# Patient Record
Sex: Female | Born: 1935 | Race: White | Hispanic: No | Smoking: Former smoker
Health system: Southern US, Community
[De-identification: ages and names within clinical notes are randomized; demographics above are authoritative.]

## PROBLEM LIST (undated history)

## (undated) DIAGNOSIS — K7689 Other specified diseases of liver: Secondary | ICD-10-CM

## (undated) DIAGNOSIS — L570 Actinic keratosis: Secondary | ICD-10-CM

## (undated) DIAGNOSIS — M810 Age-related osteoporosis without current pathological fracture: Secondary | ICD-10-CM

## (undated) DIAGNOSIS — M069 Rheumatoid arthritis, unspecified: Secondary | ICD-10-CM

## (undated) DIAGNOSIS — I2510 Atherosclerotic heart disease of native coronary artery without angina pectoris: Secondary | ICD-10-CM

## (undated) DIAGNOSIS — R918 Other nonspecific abnormal finding of lung field: Secondary | ICD-10-CM

## (undated) DIAGNOSIS — K635 Polyp of colon: Secondary | ICD-10-CM

## (undated) DIAGNOSIS — E785 Hyperlipidemia, unspecified: Secondary | ICD-10-CM

## (undated) DIAGNOSIS — E039 Hypothyroidism, unspecified: Secondary | ICD-10-CM

## (undated) DIAGNOSIS — E559 Vitamin D deficiency, unspecified: Secondary | ICD-10-CM

## (undated) DIAGNOSIS — N390 Urinary tract infection, site not specified: Secondary | ICD-10-CM

## (undated) HISTORY — PX: BLADDER SURGERY: SHX569

## (undated) HISTORY — PX: COLONOSCOPY: SHX174

## (undated) HISTORY — PX: ABDOMINAL HYSTERECTOMY: SHX81

## (undated) HISTORY — PX: OOPHORECTOMY: SHX86

---
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
## 1997-07-30 ENCOUNTER — Ambulatory Visit (HOSPITAL_COMMUNITY): Admission: RE | Admit: 1997-07-30 | Discharge: 1997-07-30 | Payer: Self-pay | Admitting: *Deleted

## 1997-07-30 ENCOUNTER — Ambulatory Visit (HOSPITAL_COMMUNITY): Admission: RE | Admit: 1997-07-30 | Discharge: 1997-07-30 | Payer: Self-pay | Admitting: Obstetrics & Gynecology

## 1998-08-05 ENCOUNTER — Ambulatory Visit (HOSPITAL_COMMUNITY): Admission: RE | Admit: 1998-08-05 | Discharge: 1998-08-05 | Payer: Self-pay | Admitting: Obstetrics & Gynecology

## 1999-08-11 ENCOUNTER — Ambulatory Visit (HOSPITAL_COMMUNITY): Admission: RE | Admit: 1999-08-11 | Discharge: 1999-08-11 | Payer: Self-pay | Admitting: Obstetrics & Gynecology

## 1999-08-16 ENCOUNTER — Ambulatory Visit (HOSPITAL_COMMUNITY): Admission: RE | Admit: 1999-08-16 | Discharge: 1999-08-16 | Payer: Self-pay | Admitting: Obstetrics & Gynecology

## 2000-08-16 ENCOUNTER — Ambulatory Visit (HOSPITAL_COMMUNITY): Admission: RE | Admit: 2000-08-16 | Discharge: 2000-08-16 | Payer: Self-pay | Admitting: Obstetrics & Gynecology

## 2001-08-22 ENCOUNTER — Ambulatory Visit (HOSPITAL_COMMUNITY): Admission: RE | Admit: 2001-08-22 | Discharge: 2001-08-22 | Payer: Self-pay | Admitting: *Deleted

## 2002-08-28 ENCOUNTER — Ambulatory Visit (HOSPITAL_COMMUNITY): Admission: RE | Admit: 2002-08-28 | Discharge: 2002-08-28 | Payer: Self-pay | Admitting: *Deleted

## 2002-08-28 ENCOUNTER — Ambulatory Visit (HOSPITAL_COMMUNITY): Admission: RE | Admit: 2002-08-28 | Discharge: 2002-08-28 | Payer: Self-pay | Admitting: Internal Medicine

## 2004-09-01 ENCOUNTER — Ambulatory Visit: Payer: Self-pay | Admitting: Internal Medicine

## 2004-12-13 ENCOUNTER — Ambulatory Visit: Payer: Self-pay | Admitting: Internal Medicine

## 2005-09-11 ENCOUNTER — Ambulatory Visit: Payer: Self-pay | Admitting: Internal Medicine

## 2005-09-24 ENCOUNTER — Ambulatory Visit: Payer: Self-pay | Admitting: Unknown Physician Specialty

## 2006-09-18 ENCOUNTER — Ambulatory Visit: Payer: Self-pay | Admitting: Internal Medicine

## 2007-09-24 ENCOUNTER — Ambulatory Visit: Payer: Self-pay | Admitting: Internal Medicine

## 2008-09-27 ENCOUNTER — Ambulatory Visit: Payer: Self-pay | Admitting: Internal Medicine

## 2009-09-28 ENCOUNTER — Ambulatory Visit: Payer: Self-pay | Admitting: Internal Medicine

## 2009-10-12 ENCOUNTER — Ambulatory Visit: Payer: Self-pay | Admitting: Internal Medicine

## 2010-11-01 ENCOUNTER — Ambulatory Visit: Payer: Self-pay | Admitting: Internal Medicine

## 2011-11-16 ENCOUNTER — Ambulatory Visit: Payer: Self-pay | Admitting: Internal Medicine

## 2012-11-18 ENCOUNTER — Ambulatory Visit: Payer: Self-pay | Admitting: Internal Medicine

## 2013-06-13 ENCOUNTER — Emergency Department: Payer: Self-pay | Admitting: Internal Medicine

## 2013-12-29 ENCOUNTER — Ambulatory Visit: Payer: Self-pay | Admitting: Internal Medicine

## 2014-01-06 ENCOUNTER — Ambulatory Visit: Payer: Self-pay | Admitting: Hospice and Palliative Medicine

## 2014-10-20 DIAGNOSIS — Z872 Personal history of diseases of the skin and subcutaneous tissue: Secondary | ICD-10-CM

## 2014-10-20 HISTORY — DX: Personal history of diseases of the skin and subcutaneous tissue: Z87.2

## 2014-12-01 ENCOUNTER — Other Ambulatory Visit: Payer: Self-pay | Admitting: Internal Medicine

## 2014-12-01 DIAGNOSIS — R911 Solitary pulmonary nodule: Secondary | ICD-10-CM

## 2014-12-01 DIAGNOSIS — Z1231 Encounter for screening mammogram for malignant neoplasm of breast: Secondary | ICD-10-CM

## 2014-12-31 ENCOUNTER — Ambulatory Visit: Payer: Self-pay

## 2015-01-07 ENCOUNTER — Ambulatory Visit
Admission: RE | Admit: 2015-01-07 | Discharge: 2015-01-07 | Disposition: A | Discharge status: Home / Self Care | Payer: Medicare Other | Source: Ambulatory Visit | Attending: Internal Medicine | Admitting: Internal Medicine

## 2015-01-07 DIAGNOSIS — I2510 Atherosclerotic heart disease of native coronary artery without angina pectoris: Secondary | ICD-10-CM | POA: Insufficient documentation

## 2015-01-07 DIAGNOSIS — J432 Centrilobular emphysema: Secondary | ICD-10-CM | POA: Diagnosis not present

## 2015-01-07 DIAGNOSIS — R918 Other nonspecific abnormal finding of lung field: Secondary | ICD-10-CM | POA: Insufficient documentation

## 2015-01-07 DIAGNOSIS — R911 Solitary pulmonary nodule: Secondary | ICD-10-CM | POA: Insufficient documentation

## 2015-01-12 ENCOUNTER — Other Ambulatory Visit: Payer: Self-pay | Admitting: Internal Medicine

## 2015-01-12 DIAGNOSIS — R911 Solitary pulmonary nodule: Secondary | ICD-10-CM

## 2015-04-12 ENCOUNTER — Ambulatory Visit: Admission: RE | Admit: 2015-04-12 | Payer: Medicare Other | Source: Ambulatory Visit

## 2015-04-12 ENCOUNTER — Ambulatory Visit
Admission: RE | Admit: 2015-04-12 | Discharge: 2015-04-12 | Disposition: A | Discharge status: Home / Self Care | Payer: Medicare Other | Source: Ambulatory Visit | Attending: Internal Medicine | Admitting: Internal Medicine

## 2015-04-12 DIAGNOSIS — I2510 Atherosclerotic heart disease of native coronary artery without angina pectoris: Secondary | ICD-10-CM | POA: Insufficient documentation

## 2015-04-12 DIAGNOSIS — R911 Solitary pulmonary nodule: Secondary | ICD-10-CM | POA: Diagnosis not present

## 2015-04-12 DIAGNOSIS — R918 Other nonspecific abnormal finding of lung field: Secondary | ICD-10-CM | POA: Insufficient documentation

## 2015-04-13 ENCOUNTER — Other Ambulatory Visit: Payer: Self-pay | Admitting: Internal Medicine

## 2015-04-13 DIAGNOSIS — R911 Solitary pulmonary nodule: Secondary | ICD-10-CM

## 2015-04-25 ENCOUNTER — Telehealth: Payer: Self-pay | Admitting: *Deleted

## 2015-04-25 NOTE — Telephone Encounter (Signed)
Spoke to patient regarding her CT scan for lung cancer screening. She reported having a CT scan done on 04-12-15. She already has next years scheduled for 04-11-16. I will update our records for the screening program.

## 2015-11-16 ENCOUNTER — Ambulatory Visit: Payer: Medicare Other | Admitting: Anesthesiology

## 2015-11-16 ENCOUNTER — Ambulatory Visit
Admission: RE | Admit: 2015-11-16 | Discharge: 2015-11-16 | Disposition: A | Discharge status: Home / Self Care | Payer: Medicare Other | Source: Ambulatory Visit | Attending: Unknown Physician Specialty | Admitting: Unknown Physician Specialty

## 2015-11-16 ENCOUNTER — Encounter: Payer: Self-pay | Admitting: *Deleted

## 2015-11-16 ENCOUNTER — Encounter
Admission: RE | Disposition: A | Discharge status: Home / Self Care | Payer: Self-pay | Source: Ambulatory Visit | Attending: Unknown Physician Specialty

## 2015-11-16 DIAGNOSIS — Z79899 Other long term (current) drug therapy: Secondary | ICD-10-CM | POA: Insufficient documentation

## 2015-11-16 DIAGNOSIS — Z7982 Long term (current) use of aspirin: Secondary | ICD-10-CM | POA: Insufficient documentation

## 2015-11-16 DIAGNOSIS — E559 Vitamin D deficiency, unspecified: Secondary | ICD-10-CM | POA: Diagnosis not present

## 2015-11-16 DIAGNOSIS — E039 Hypothyroidism, unspecified: Secondary | ICD-10-CM | POA: Insufficient documentation

## 2015-11-16 DIAGNOSIS — Z880 Allergy status to penicillin: Secondary | ICD-10-CM | POA: Diagnosis not present

## 2015-11-16 DIAGNOSIS — I2510 Atherosclerotic heart disease of native coronary artery without angina pectoris: Secondary | ICD-10-CM | POA: Insufficient documentation

## 2015-11-16 DIAGNOSIS — Z86010 Personal history of colonic polyps: Secondary | ICD-10-CM | POA: Diagnosis not present

## 2015-11-16 DIAGNOSIS — Z1211 Encounter for screening for malignant neoplasm of colon: Secondary | ICD-10-CM | POA: Diagnosis present

## 2015-11-16 DIAGNOSIS — R918 Other nonspecific abnormal finding of lung field: Secondary | ICD-10-CM | POA: Insufficient documentation

## 2015-11-16 DIAGNOSIS — E785 Hyperlipidemia, unspecified: Secondary | ICD-10-CM | POA: Diagnosis not present

## 2015-11-16 DIAGNOSIS — Z882 Allergy status to sulfonamides status: Secondary | ICD-10-CM | POA: Diagnosis not present

## 2015-11-16 DIAGNOSIS — K621 Rectal polyp: Secondary | ICD-10-CM | POA: Diagnosis not present

## 2015-11-16 DIAGNOSIS — Z90710 Acquired absence of both cervix and uterus: Secondary | ICD-10-CM | POA: Diagnosis not present

## 2015-11-16 DIAGNOSIS — F17210 Nicotine dependence, cigarettes, uncomplicated: Secondary | ICD-10-CM | POA: Diagnosis not present

## 2015-11-16 DIAGNOSIS — M810 Age-related osteoporosis without current pathological fracture: Secondary | ICD-10-CM | POA: Insufficient documentation

## 2015-11-16 DIAGNOSIS — Z87440 Personal history of urinary (tract) infections: Secondary | ICD-10-CM | POA: Diagnosis not present

## 2015-11-16 HISTORY — DX: Vitamin D deficiency, unspecified: E55.9

## 2015-11-16 HISTORY — DX: Age-related osteoporosis without current pathological fracture: M81.0

## 2015-11-16 HISTORY — DX: Hypothyroidism, unspecified: E03.9

## 2015-11-16 HISTORY — PX: COLONOSCOPY WITH PROPOFOL: SHX5780

## 2015-11-16 HISTORY — DX: Urinary tract infection, site not specified: N39.0

## 2015-11-16 HISTORY — DX: Atherosclerotic heart disease of native coronary artery without angina pectoris: I25.10

## 2015-11-16 HISTORY — DX: Hyperlipidemia, unspecified: E78.5

## 2015-11-16 HISTORY — DX: Other nonspecific abnormal finding of lung field: R91.8

## 2015-11-16 HISTORY — DX: Polyp of colon: K63.5

## 2015-11-16 HISTORY — DX: Other specified diseases of liver: K76.89

## 2015-11-16 HISTORY — PX: ESOPHAGOGASTRODUODENOSCOPY (EGD) WITH PROPOFOL: SHX5813

## 2015-11-16 SURGERY — ESOPHAGOGASTRODUODENOSCOPY (EGD) WITH PROPOFOL
Anesthesia: General

## 2015-11-16 MED ORDER — SODIUM CHLORIDE 0.9 % IV SOLN: INTRAVENOUS | Status: DC

## 2015-11-16 MED ORDER — PROPOFOL 500 MG/50ML IV EMUL
INTRAVENOUS | Status: DC | PRN
  Administered 2015-11-16: 125 ug/kg/min via INTRAVENOUS

## 2015-11-16 MED ORDER — PHENYLEPHRINE HCL 10 MG/ML IJ SOLN
INTRAMUSCULAR | Status: DC | PRN
  Administered 2015-11-16: 200 ug via INTRAVENOUS

## 2015-11-16 MED ORDER — SODIUM CHLORIDE 0.9 % IV SOLN
INTRAVENOUS | Status: DC
  Administered 2015-11-16: 09:00:00 via INTRAVENOUS

## 2015-11-16 MED ORDER — PROPOFOL 10 MG/ML IV BOLUS
INTRAVENOUS | Status: DC | PRN
  Administered 2015-11-16: 50 mg via INTRAVENOUS

## 2015-11-16 MED ORDER — ONDANSETRON HCL 4 MG/2ML IJ SOLN: 4.0000 mg | Freq: Once | INTRAMUSCULAR | Status: DC | PRN

## 2015-11-16 MED ORDER — FENTANYL CITRATE (PF) 100 MCG/2ML IJ SOLN: 25.0000 ug | INTRAMUSCULAR | Status: DC | PRN

## 2015-11-16 NOTE — Transfer of Care (Signed)
Immediate Anesthesia Transfer of Care Note  Patient: Kaitlyn Bridges  Procedure(s) Performed: Procedure(s): ESOPHAGOGASTRODUODENOSCOPY (EGD) WITH PROPOFOL (N/A) COLONOSCOPY WITH PROPOFOL (N/A)  Patient Location: PACU  Anesthesia Type:General  Level of Consciousness: awake, alert  and oriented  Airway & Oxygen Therapy: Patient Spontanous Breathing and Patient connected to nasal cannula oxygen  Post-op Assessment: Report given to RN and Post -op Vital signs reviewed and stable  Post vital signs: Reviewed and stable  Last Vitals:  Vitals:   11/16/15 0951 11/16/15 0954  BP: (P) 133/80 133/80  Pulse: (P) 80 78  Resp: (P) 20   Temp: 36.3 C     Last Pain:  Vitals:   11/16/15 0954  TempSrc: Tympanic         Complications: No apparent anesthesia complications

## 2015-11-16 NOTE — H&P (Signed)
Primary Care Physician:  Adin Hector, MD Primary Gastroenterologist:  Dr. Vira Agar  Pre-Procedure History & Physical: HPI:  Kaitlyn Bridges is a 81 y.o. female is here for an colonoscopy.   Past Medical History:  Diagnosis Date  . Colon polyps   . Coronary artery disease   . Hyperlipidemia   . Hypothyroidism   . Liver cyst   . Lung nodules   . Osteoporosis   . Recurrent UTI   . Vitamin D deficiency     Past Surgical History:  Procedure Laterality Date  . ABDOMINAL HYSTERECTOMY    . BLADDER SURGERY    . COLONOSCOPY      Prior to Admission medications   Medication Sig Start Date End Date Taking? Authorizing Provider  aspirin EC 81 MG tablet Take 81 mg by mouth daily.   Yes Historical Provider, MD  benzonatate (TESSALON) 200 MG capsule Take 200 mg by mouth 3 (three) times daily as needed for cough.   Yes Historical Provider, MD  buPROPion (WELLBUTRIN XL) 150 MG 24 hr tablet Take 150 mg by mouth daily.   Yes Historical Provider, MD  calcium carbonate (OSCAL) 1500 (600 Ca) MG TABS tablet Take by mouth 2 (two) times daily with a meal.   Yes Historical Provider, MD  levothyroxine (SYNTHROID, LEVOTHROID) 112 MCG tablet Take 112 mcg by mouth daily before breakfast.   Yes Historical Provider, MD  lovastatin (ALTOPREV) 40 MG 24 hr tablet Take 40 mg by mouth at bedtime.   Yes Historical Provider, MD  varenicline (CHANTIX) 1 MG tablet Take 1 mg by mouth 2 (two) times daily.   Yes Historical Provider, MD    Allergies as of 09/22/2015 - never reviewed  Allergen Reaction Noted  . Penicillins Hives 01/07/2015  . Sulfa antibiotics Rash 01/07/2015    History reviewed. No pertinent family history.  Social History   Social History  . Marital status: Divorced    Spouse name: N/A  . Number of children: N/A  . Years of education: N/A   Occupational History  . Not on file.   Social History Main Topics  . Smoking status: Current Every Day Smoker    Packs/day: 0.75    Types:  Cigarettes  . Smokeless tobacco: Never Used  . Alcohol use 4.2 oz/week    7 Glasses of wine per week     Comment: weekly  . Drug use: No  . Sexual activity: Not on file   Other Topics Concern  . Not on file   Social History Narrative  . No narrative on file    Review of Systems: See HPI, otherwise negative ROS  Physical Exam: BP 116/79   Pulse 76   Temp 98.4 F (36.9 C) (Tympanic)   Resp 18   Ht 5' 2.5" (1.588 m)   Wt 51.7 kg (114 lb)   SpO2 98%   BMI 20.52 kg/m  General:   Alert,  pleasant and cooperative in NAD Head:  Normocephalic and atraumatic. Neck:  Supple; no masses or thyromegaly. Lungs:  Clear throughout to auscultation.    Heart:  Regular rate and rhythm. Abdomen:  Soft, nontender and nondistended. Normal bowel sounds, without guarding, and without rebound.   Neurologic:  Alert and  oriented x4;  grossly normal neurologically.  Impression/Plan: Kaitlyn Bridges is here for an colonoscopy to be performed for screening  Risks, benefits, limitations, and alternatives regarding  colonoscopy have been reviewed with the patient.  Questions have been answered.  All  parties agreeable.   Gaylyn Cheers, MD  11/16/2015, 9:21 AM

## 2015-11-16 NOTE — Anesthesia Preprocedure Evaluation (Signed)
Anesthesia Evaluation  Patient identified by MRN, date of birth, ID band Patient awake    Reviewed: Allergy & Precautions, NPO status , Patient's Chart, lab work & pertinent test results  Airway Mallampati: II       Dental   Pulmonary Current Smoker,    Pulmonary exam normal        Cardiovascular + CAD  Normal cardiovascular exam     Neuro/Psych negative neurological ROS  negative psych ROS   GI/Hepatic Neg liver ROS, Hx of colon polyps   Endo/Other  Hypothyroidism   Renal/GU negative Renal ROS  negative genitourinary   Musculoskeletal negative musculoskeletal ROS (+)   Abdominal Normal abdominal exam  (+)   Peds negative pediatric ROS (+)  Hematology negative hematology ROS (+)   Anesthesia Other Findings   Reproductive/Obstetrics                             Anesthesia Physical Anesthesia Plan  ASA: III  Anesthesia Plan: General   Post-op Pain Management:    Induction: Intravenous  Airway Management Planned: Nasal Cannula  Additional Equipment:   Intra-op Plan:   Post-operative Plan:   Informed Consent: I have reviewed the patients History and Physical, chart, labs and discussed the procedure including the risks, benefits and alternatives for the proposed anesthesia with the patient or authorized representative who has indicated his/her understanding and acceptance.   Dental advisory given  Plan Discussed with: CRNA and Surgeon  Anesthesia Plan Comments:         Anesthesia Quick Evaluation

## 2015-11-16 NOTE — Anesthesia Postprocedure Evaluation (Signed)
Anesthesia Post Note  Patient: LULA SCHLAU  Procedure(s) Performed: Procedure(s) (LRB): ESOPHAGOGASTRODUODENOSCOPY (EGD) WITH PROPOFOL (N/A) COLONOSCOPY WITH PROPOFOL (N/A)  Patient location during evaluation: PACU Anesthesia Type: General Level of consciousness: awake and alert and oriented Pain management: pain level controlled Vital Signs Assessment: post-procedure vital signs reviewed and stable Respiratory status: spontaneous breathing Cardiovascular status: blood pressure returned to baseline Anesthetic complications: no    Last Vitals:  Vitals:   11/16/15 1011 11/16/15 1021  BP: (!) 117/96 123/90  Pulse: 68 74  Resp: 17 (!) 23  Temp:      Last Pain:  Vitals:   11/16/15 0954  TempSrc: Tympanic                 Kiarrah Rausch

## 2015-11-16 NOTE — Op Note (Signed)
[facility] Gastroenterology Patient Name: Kaitlyn Bridges Procedure Date: 11/16/2015 9:23 AM MRN: [id number] Account #: [id number] Date of Birth: [birth year] Admit Type: Outpatient Age: 81 Room: [facility] ENDO ROOM 4 Gender: Female Note Status: Finalized Procedure:            Colonoscopy Indications:          Screening for colorectal malignant neoplasm Providers:            Manya Silvas, MD Referring MD:         Ramonita Lab, MD (Referring MD) Medicines:            Propofol per Anesthesia Complications:        No immediate complications. Procedure:            Pre-Anesthesia Assessment:                       - After reviewing the risks and benefits, the patient                        was deemed in satisfactory condition to undergo the                        procedure.                       After obtaining informed consent, the colonoscope was                        passed under direct vision. Throughout the procedure,                        the patient's blood pressure, pulse, and oxygen                        saturations were monitored continuously. The                        Colonoscope was introduced through the anus and                        advanced to the the cecum, identified by appendiceal                        orifice and ileocecal valve. The colonoscopy was                        performed without difficulty. The patient tolerated the                        procedure well. The quality of the bowel preparation                        was excellent. Findings:      A diminutive polyp was found in the rectum. The polyp was sessile. The       polyp was removed with a jumbo cold forceps. Resection and retrieval       were complete.      The exam was otherwise without abnormality. Impression:           - One diminutive polyp in the rectum, removed with a  jumbo cold forceps. Resected and retrieved.                       - The examination was  otherwise normal. Recommendation:       - Await pathology results. Manya Silvas, MD 11/16/2015 9:48:56 AM This report has been signed electronically. Number of Addenda: 0 Note Initiated On: 11/16/2015 9:23 AM Scope Withdrawal Time: 0 hours 7 minutes 14 seconds  Total Procedure Duration: 0 hours 17 minutes 29 seconds       [facility]

## 2015-11-17 ENCOUNTER — Encounter: Payer: Self-pay | Admitting: Unknown Physician Specialty

## 2015-11-17 LAB — SURGICAL PATHOLOGY

## 2015-12-05 ENCOUNTER — Other Ambulatory Visit: Payer: Self-pay | Admitting: Internal Medicine

## 2015-12-05 DIAGNOSIS — Z1231 Encounter for screening mammogram for malignant neoplasm of breast: Secondary | ICD-10-CM

## 2016-01-12 ENCOUNTER — Ambulatory Visit
Admission: RE | Admit: 2016-01-12 | Discharge: 2016-01-12 | Disposition: A | Discharge status: Home / Self Care | Payer: Medicare Other | Source: Ambulatory Visit | Attending: Internal Medicine | Admitting: Internal Medicine

## 2016-01-12 DIAGNOSIS — Z1231 Encounter for screening mammogram for malignant neoplasm of breast: Secondary | ICD-10-CM | POA: Insufficient documentation

## 2016-04-11 ENCOUNTER — Ambulatory Visit
Admission: RE | Admit: 2016-04-11 | Discharge: 2016-04-11 | Disposition: A | Discharge status: Home / Self Care | Payer: Medicare Other | Source: Ambulatory Visit | Attending: Internal Medicine | Admitting: Internal Medicine

## 2016-04-11 DIAGNOSIS — X58XXXA Exposure to other specified factors, initial encounter: Secondary | ICD-10-CM | POA: Diagnosis not present

## 2016-04-11 DIAGNOSIS — R911 Solitary pulmonary nodule: Secondary | ICD-10-CM

## 2016-04-11 DIAGNOSIS — S22039A Unspecified fracture of third thoracic vertebra, initial encounter for closed fracture: Secondary | ICD-10-CM | POA: Insufficient documentation

## 2016-04-11 DIAGNOSIS — J432 Centrilobular emphysema: Secondary | ICD-10-CM | POA: Insufficient documentation

## 2016-04-11 DIAGNOSIS — I2510 Atherosclerotic heart disease of native coronary artery without angina pectoris: Secondary | ICD-10-CM | POA: Diagnosis not present

## 2016-12-27 ENCOUNTER — Other Ambulatory Visit: Payer: Self-pay | Admitting: Internal Medicine

## 2016-12-27 DIAGNOSIS — Z1231 Encounter for screening mammogram for malignant neoplasm of breast: Secondary | ICD-10-CM

## 2017-01-21 ENCOUNTER — Ambulatory Visit
Admission: RE | Admit: 2017-01-21 | Discharge: 2017-01-21 | Disposition: A | Discharge status: Home / Self Care | Payer: Medicare Other | Source: Ambulatory Visit | Attending: Internal Medicine | Admitting: Internal Medicine

## 2017-01-21 DIAGNOSIS — Z1231 Encounter for screening mammogram for malignant neoplasm of breast: Secondary | ICD-10-CM | POA: Diagnosis not present

## 2017-11-15 IMAGING — CT CT CHEST W/O CM
1 series · 15 of 34 positions shown, 19 images · non-contrast
Comparison: 01/07/2015

CLINICAL DATA: Follow-up lung nodule

EXAM:
CT CHEST WITHOUT CONTRAST
TECHNIQUE: Multidetector CT imaging of the chest was performed following the
standard protocol without IV contrast.

[Series 2: thorax · axial · 0.60mm/px · z∈[-576,-324]mm · 15 of 148 slices shown, 19 images]
[im 11/148  mediastinal]
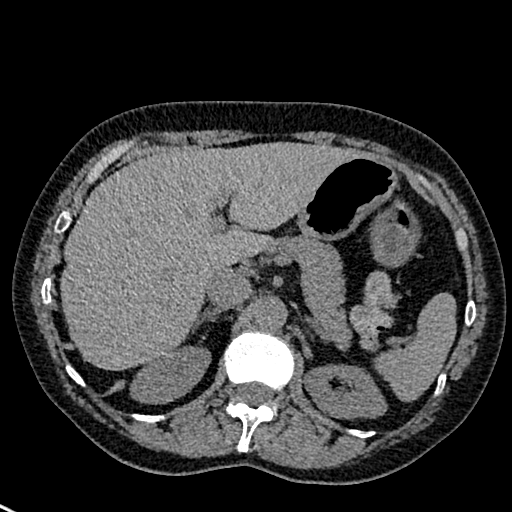
[im 11/148  lung]
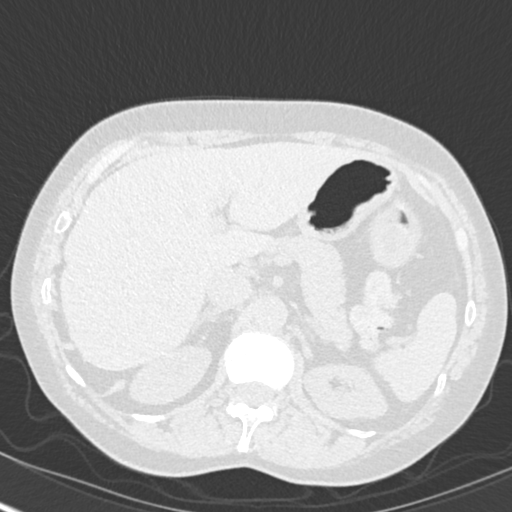
[im 22/148  lung]
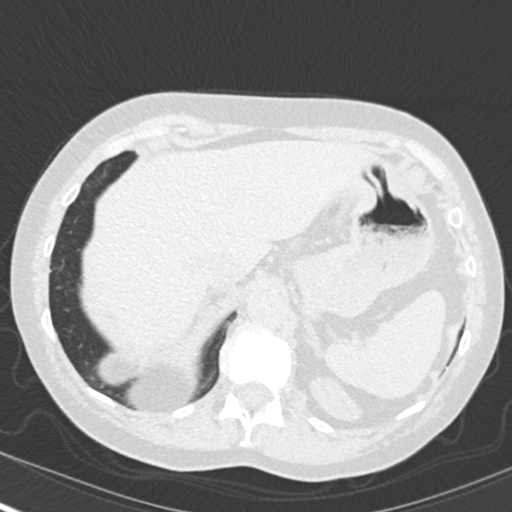
[im 30/148  lung]
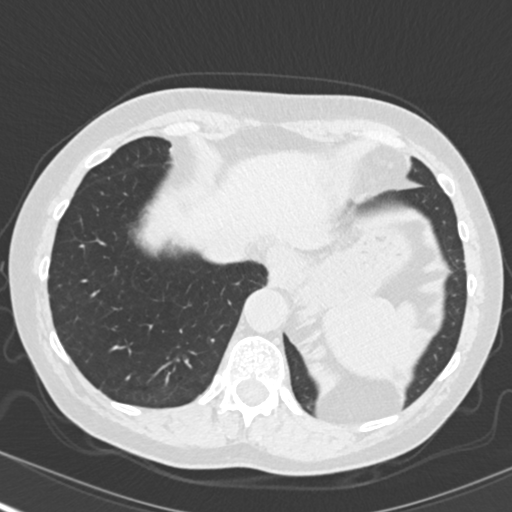
[im 39/148  lung]
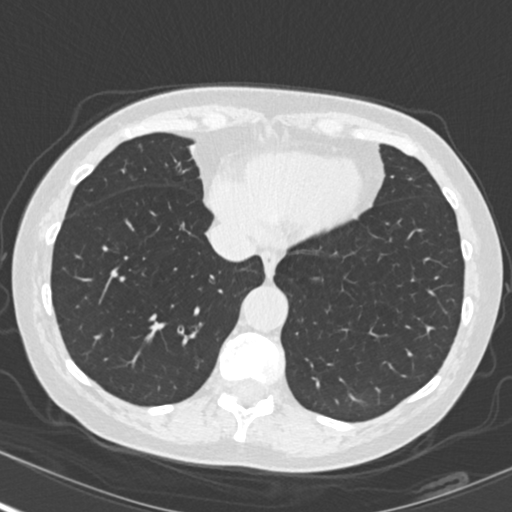
[im 50/148  mediastinal]
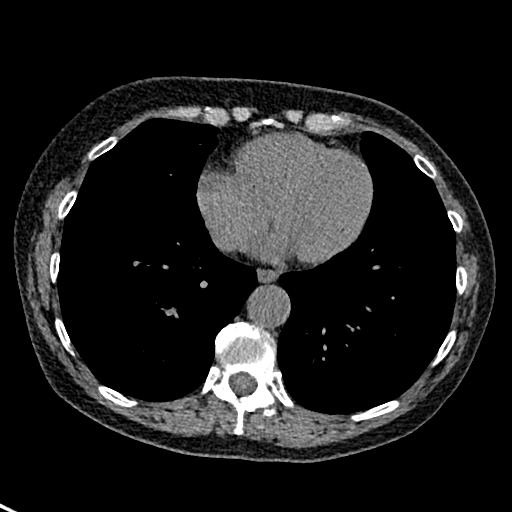
[im 50/148  lung]
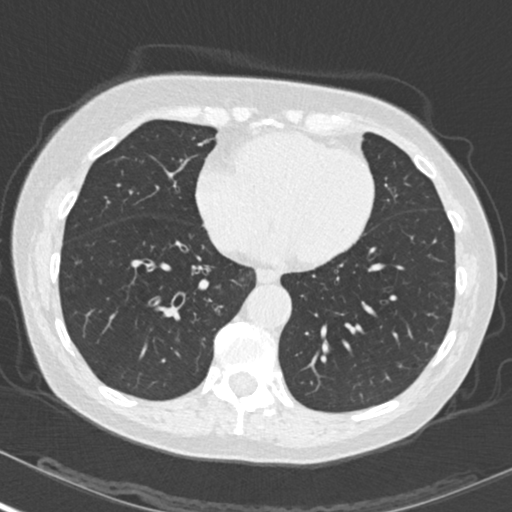
[im 59/148  lung]
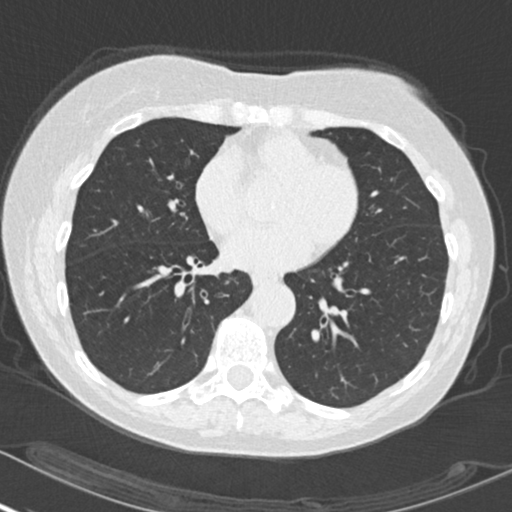
[im 66/148  lung]
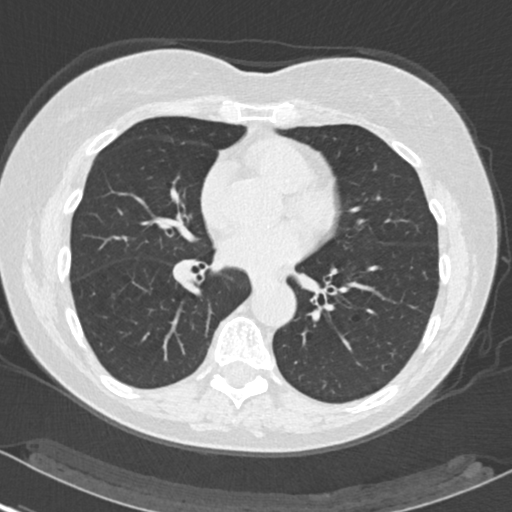
[im 77/148  lung]
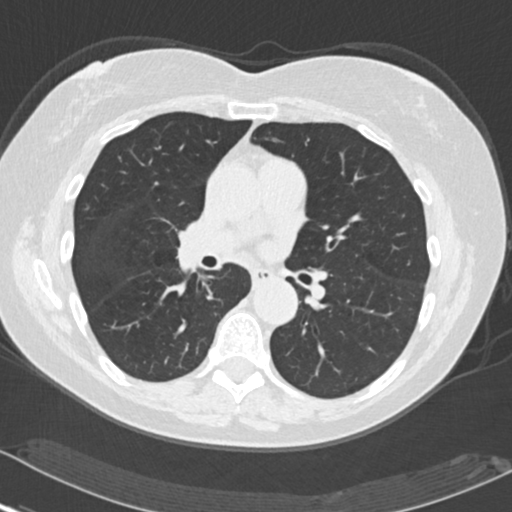
[im 82/148  mediastinal]
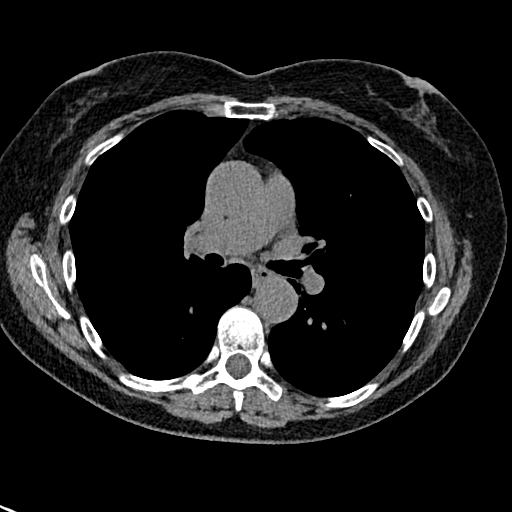
[im 82/148  lung]
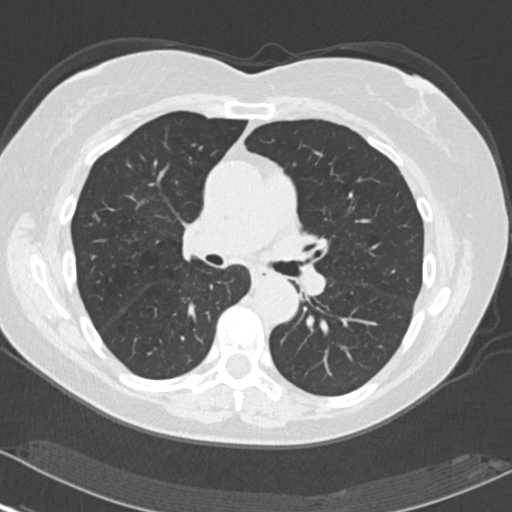
[im 89/148  lung]
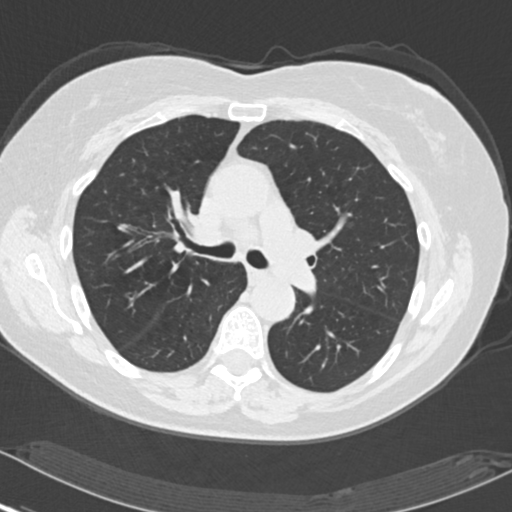
[im 99/148  lung]
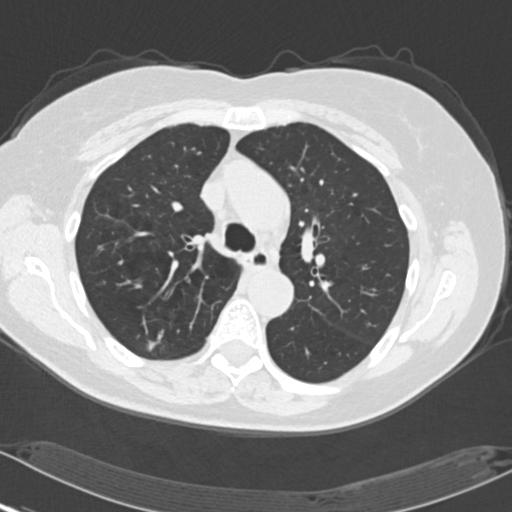
[im 109/148  lung]
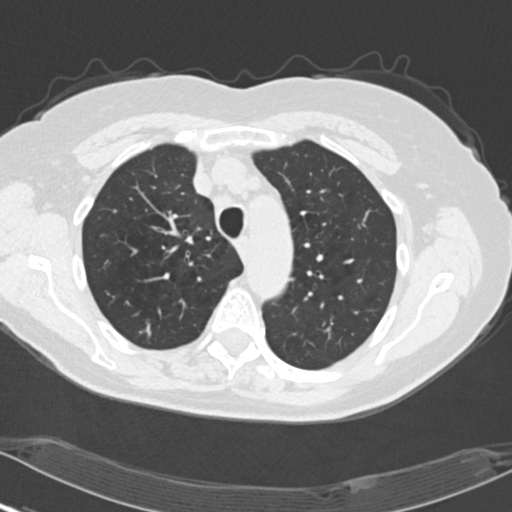
[im 118/148  mediastinal]
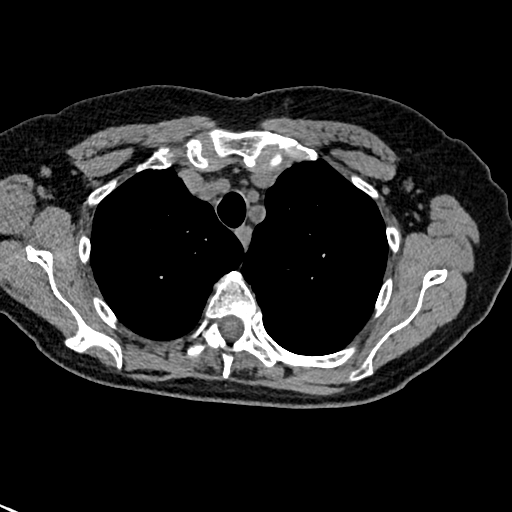
[im 118/148  lung]
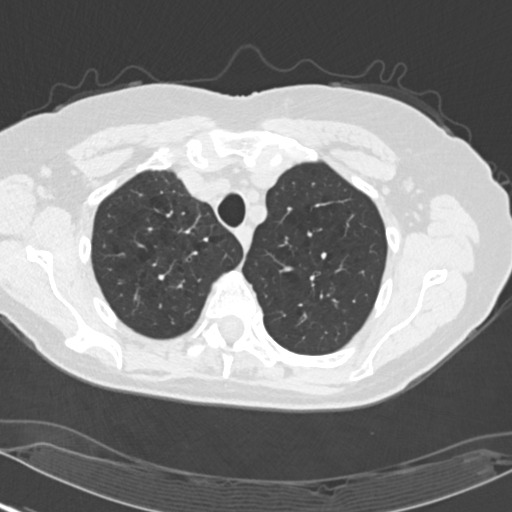
[im 126/148  lung]
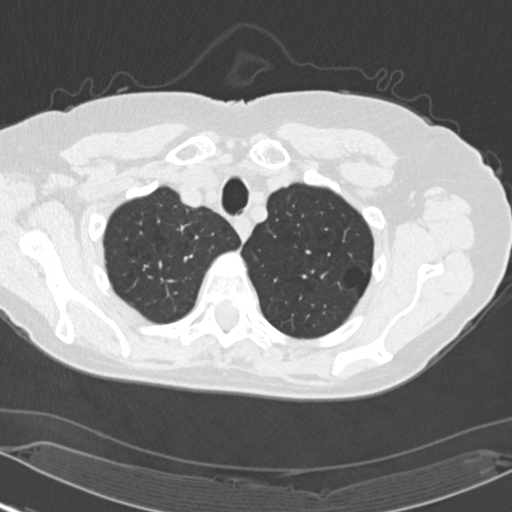
[im 137/148  lung]
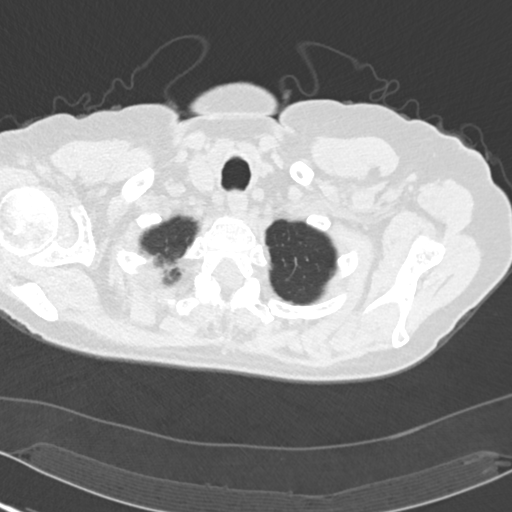

[15 of 34 positions shown; findings below may reference images not displayed]

FINDINGS: Mediastinum/Nodes: Scattered calcifications in the left main and
left anterior descending coronary arteries. Heart is normal size.
Aorta is normal caliber. Small scattered mediastinal lymph nodes,
stable since prior study. No axillary adenopathy. Difficult to
assess for hilar lymph nodes without intravenous contrast.

Lungs/Pleura: Previously seen left lower lobe nodule no longer
visualized. Right upper lobe nodule posteriorly is again noted,
measuring 4 mm compared with 5 mm previously on image 44. Previously
seen ground-glass opacity medially within the right upper lobe as
nearly completely resolved, now with residual solid nodular density
of 3 mm. Other small scattered 1-3 mm nodules are stable. No new or
enlarging pulmonary nodules. No pleural effusions.

Upper abdomen: Imaging into the upper abdomen shows no acute
findings.

Musculoskeletal: Chest wall soft tissues are unremarkable. No acute
bony abnormality or focal bone lesion. Mild compression fractures
involving the T3 and T4 vertebral bodies are stable.
IMPRESSION: The majority of the scattered nodules are stable since prior study.
Improvement in the previously seen ground-glass opacity in the
medial right upper lobe with minimal residual 3 mm solid nodule. No
follow-up needed if patient is low-risk (and has no known or
suspected primary neoplasm). Non-contrast chest CT can be considered
in 12 months if patient is high-risk. This recommendation follows
the consensus statement: Guidelines for Management of Incidental
Pulmonary Nodules Detected on CT Images:From the [HOSPITAL]
0264; published online before print (10.1148/radiol.7598959586).

Coronary artery disease.

## 2018-02-05 ENCOUNTER — Other Ambulatory Visit: Payer: Self-pay | Admitting: Internal Medicine

## 2018-02-05 DIAGNOSIS — Z1231 Encounter for screening mammogram for malignant neoplasm of breast: Secondary | ICD-10-CM

## 2018-02-18 ENCOUNTER — Ambulatory Visit
Admission: RE | Admit: 2018-02-18 | Discharge: 2018-02-18 | Disposition: A | Discharge status: Home / Self Care | Payer: Medicare Other | Source: Ambulatory Visit | Attending: Internal Medicine | Admitting: Internal Medicine

## 2018-02-18 DIAGNOSIS — Z1231 Encounter for screening mammogram for malignant neoplasm of breast: Secondary | ICD-10-CM | POA: Diagnosis present

## 2018-11-03 ENCOUNTER — Emergency Department
Admission: EM | Admit: 2018-11-03 | Discharge: 2018-11-03 | Disposition: A | Discharge status: Home / Self Care | Payer: Medicare Other | Attending: Emergency Medicine | Admitting: Emergency Medicine

## 2018-11-03 ENCOUNTER — Other Ambulatory Visit: Payer: Self-pay

## 2018-11-03 ENCOUNTER — Encounter: Payer: Self-pay | Admitting: Emergency Medicine

## 2018-11-03 ENCOUNTER — Emergency Department: Payer: Medicare Other

## 2018-11-03 DIAGNOSIS — E039 Hypothyroidism, unspecified: Secondary | ICD-10-CM | POA: Insufficient documentation

## 2018-11-03 DIAGNOSIS — Z7982 Long term (current) use of aspirin: Secondary | ICD-10-CM | POA: Diagnosis not present

## 2018-11-03 DIAGNOSIS — F17210 Nicotine dependence, cigarettes, uncomplicated: Secondary | ICD-10-CM | POA: Diagnosis not present

## 2018-11-03 DIAGNOSIS — Z79899 Other long term (current) drug therapy: Secondary | ICD-10-CM | POA: Diagnosis not present

## 2018-11-03 DIAGNOSIS — I2510 Atherosclerotic heart disease of native coronary artery without angina pectoris: Secondary | ICD-10-CM | POA: Insufficient documentation

## 2018-11-03 DIAGNOSIS — M2550 Pain in unspecified joint: Secondary | ICD-10-CM | POA: Diagnosis present

## 2018-11-03 LAB — CBC WITH DIFFERENTIAL/PLATELET
Abs Immature Granulocytes: 0.08 10*3/uL — ABNORMAL HIGH (ref 0.00–0.07)
Basophils Absolute: 0.1 10*3/uL (ref 0.0–0.1)
Basophils Relative: 1 %
Eosinophils Absolute: 0.2 10*3/uL (ref 0.0–0.5)
Eosinophils Relative: 2 %
HCT: 37.7 % (ref 36.0–46.0)
Hemoglobin: 12.1 g/dL (ref 12.0–15.0)
Immature Granulocytes: 1 %
Lymphocytes Relative: 19 %
Lymphs Abs: 2.4 10*3/uL (ref 0.7–4.0)
MCH: 30 pg (ref 26.0–34.0)
MCHC: 32.1 g/dL (ref 30.0–36.0)
MCV: 93.5 fL (ref 80.0–100.0)
Monocytes Absolute: 0.8 10*3/uL (ref 0.1–1.0)
Monocytes Relative: 6 %
Neutro Abs: 9.2 10*3/uL — ABNORMAL HIGH (ref 1.7–7.7)
Neutrophils Relative %: 71 %
Platelets: 452 10*3/uL — ABNORMAL HIGH (ref 150–400)
RBC: 4.03 MIL/uL (ref 3.87–5.11)
RDW: 12.4 % (ref 11.5–15.5)
WBC: 12.8 10*3/uL — ABNORMAL HIGH (ref 4.0–10.5)
nRBC: 0 % (ref 0.0–0.2)

## 2018-11-03 LAB — URINALYSIS, COMPLETE (UACMP) WITH MICROSCOPIC
Bacteria, UA: NONE SEEN
Bilirubin Urine: NEGATIVE
Glucose, UA: NEGATIVE mg/dL
Hgb urine dipstick: NEGATIVE
Ketones, ur: NEGATIVE mg/dL
Leukocytes,Ua: NEGATIVE
Nitrite: NEGATIVE
Protein, ur: NEGATIVE mg/dL
Specific Gravity, Urine: 1.008 (ref 1.005–1.030)
pH: 7 (ref 5.0–8.0)

## 2018-11-03 LAB — BASIC METABOLIC PANEL
Anion gap: 14 (ref 5–15)
BUN: 12 mg/dL (ref 8–23)
CO2: 25 mmol/L (ref 22–32)
Calcium: 8.8 mg/dL — ABNORMAL LOW (ref 8.9–10.3)
Chloride: 99 mmol/L (ref 98–111)
Creatinine, Ser: 0.68 mg/dL (ref 0.44–1.00)
GFR calc Af Amer: >60 mL/min (ref >60)
GFR calc non Af Amer: >60 mL/min (ref >60)
Glucose, Bld: 99 mg/dL (ref 70–99)
Potassium: 4.1 mmol/L (ref 3.5–5.1)
Sodium: 138 mmol/L (ref 135–145)

## 2018-11-03 LAB — SEDIMENTATION RATE: Sed Rate: 62 mm/hr — ABNORMAL HIGH (ref 0–30)

## 2018-11-03 MED ORDER — TRAMADOL HCL 50 MG PO TABS: 50.0000 mg | ORAL_TABLET | Freq: Three times a day (TID) | ORAL | 0 refills | Status: DC | PRN

## 2018-11-03 MED ORDER — TRAMADOL HCL 50 MG PO TABS
50.0000 mg | ORAL_TABLET | Freq: Once | ORAL | Status: AC
  Administered 2018-11-03: 15:00:00 50 mg via ORAL
  Filled 2018-11-03: qty 1

## 2018-11-03 NOTE — Discharge Instructions (Addendum)
Follow-up with Dr. Caryl Comes for evaluation of your joint pain.  More images may need to be done to evaluate more thoroughly.  Do not get on any ladders or stand on stools that may increase your chances of falling with a more severe injury.  Take the tramadol every 8 hours if needed for pain.  Be aware that this medication could cause drowsiness and increase your risk for falling.  Also use the canes that are down in the basement to give you more stability when you are walking.

## 2018-11-03 NOTE — ED Provider Notes (Signed)
[facility] Emergency Department Provider Note   ____________________________________________   First MD Initiated Contact with Patient 11/03/18 1430     (approximate)  I have reviewed the triage vital signs and the nursing notes.   HISTORY  Chief Complaint Joint Pain and Shoulder Pain   HPI Kaitlyn Bridges is a 84 y.o. female presents to the ED with complaint of multiple joint pain.  Patient states that she has had discomfort for 3 weeks.  Patient states that she was on a ladder chopping a tree using pruning shears.  She states that the repetitive movement of using the shears has caused her shoulders to hurt bilaterally.  She denies any fall or injury while on the ladder.  Patient states that after this event she began having more joint and muscle aches.  She takes 1 or 2 Tylenol per day without any relief of her symptoms.  Patient denies any fever, chills, nausea or vomiting.  She denies any abdominal pain or urinary symptoms.  Currently she rates her pain as a 0/10 because she is sitting still.  Walking increases her discomfort.     Past Medical History:  Diagnosis Date   Colon polyps    Coronary artery disease    Hyperlipidemia    Hypothyroidism    Liver cyst    Lung nodules    Osteoporosis    Recurrent UTI    Vitamin D deficiency     There are no active problems to display for this patient.   Past Surgical History:  Procedure Laterality Date   ABDOMINAL HYSTERECTOMY     BLADDER SURGERY     COLONOSCOPY     COLONOSCOPY WITH PROPOFOL N/A 11/16/2015   Procedure: COLONOSCOPY WITH PROPOFOL;  Surgeon: Manya Silvas, MD;  Location: [facility] ENDOSCOPY;  Service: Endoscopy;  Laterality: N/A;   ESOPHAGOGASTRODUODENOSCOPY (EGD) WITH PROPOFOL N/A 11/16/2015   Procedure: ESOPHAGOGASTRODUODENOSCOPY (EGD) WITH PROPOFOL;  Surgeon: Manya Silvas, MD;  Location: [facility] ENDOSCOPY;  Service: Endoscopy;  Laterality: N/A;   OOPHORECTOMY      Prior  to Admission medications   Medication Sig Start Date End Date Taking? Authorizing Provider  aspirin EC 81 MG tablet Take 81 mg by mouth daily.    [provider]  buPROPion (WELLBUTRIN XL) 150 MG 24 hr tablet Take 150 mg by mouth daily.    [provider]  calcium carbonate (OSCAL) 1500 (600 Ca) MG TABS tablet Take by mouth 2 (two) times daily with a meal.    [provider]  levothyroxine (SYNTHROID, LEVOTHROID) 112 MCG tablet Take 112 mcg by mouth daily before breakfast.    [provider]  lovastatin (ALTOPREV) 40 MG 24 hr tablet Take 40 mg by mouth at bedtime.    [provider]  traMADol (ULTRAM) 50 MG tablet Take 1 tablet (50 mg total) by mouth every 8 (eight) hours as needed for moderate pain. 11/03/18   Johnn Hai, PA-C    Allergies Penicillins and Sulfa antibiotics  No family history on file.  Social History Social History   Tobacco Use   Smoking status: Current Every Day Smoker    Packs/day: 0.75    Types: Cigarettes   Smokeless tobacco: Never Used  Substance Use Topics   Alcohol use: Yes    Alcohol/week: 7.0 standard drinks    Types: 7 Glasses of wine per week    Comment: weekly   Drug use: No    Review of Systems Constitutional: No fever/chills Eyes:  No visual changes. Cardiovascular: Denies chest pain. Respiratory: Denies shortness of breath. Gastrointestinal: No abdominal pain.  No nausea, no vomiting.  No diarrhea.  No constipation. Genitourinary: Negative for dysuria. Musculoskeletal: Positive for multiple joint aches including bilateral shoulders, back, bilateral knees. Skin: Negative for rash. Neurological: Negative for headaches, focal weakness or numbness. ____________________________________________   PHYSICAL EXAM:  VITAL SIGNS: ED Triage Vitals  Enc Vitals Group     BP 11/03/18 1240 133/75     Pulse Rate 11/03/18 1240 98     Resp 11/03/18 1240 16     Temp 11/03/18 1240 97.8 F (36.6 C)       Temp src --      SpO2 11/03/18 1240 94 %     Weight 11/03/18 1242 119 lb (54 kg)     Height 11/03/18 1242 5\' 2"  (1.575 m)     Head Circumference --      Peak Flow --      Pain Score 11/03/18 1242 0     Pain Loc --      Pain Edu? --      Excl. in [place]? --     Constitutional: Alert and oriented. Well appearing and in no acute distress. Eyes: Conjunctivae are normal.  Head: Atraumatic. Neck: No stridor.   Cardiovascular: Normal rate, regular rhythm. Grossly normal heart sounds.  Good peripheral circulation. Respiratory: Normal respiratory effort.  No retractions. Lungs CTAB. Gastrointestinal: Soft and nontender. No distention. Musculoskeletal: No gross deformities noted of the shoulders bilaterally and no soft tissue injury present.  Patient is able to move with crepitus and some guarding.  No erythema or discoloration noted.  Patient is also tender to palpation bilateral knees and ankles.  No soft tissue edema, erythema, ecchymosis is noted on the lower extremities to suggest an injury.  There is no warmth or redness to suggest a septic joint.  Patient is able to ambulate without any assistance. Neurologic:  Normal speech and language. No gross focal neurologic deficits are appreciated. No gait instability. Skin:  Skin is warm, dry and intact. No rash noted. Psychiatric: Mood and affect are normal. Speech and behavior are normal.  ____________________________________________   LABS (all labs ordered are listed, but only abnormal results are displayed)  Labs Reviewed  CBC WITH DIFFERENTIAL/PLATELET - Abnormal; Notable for the following components:      Result Value   WBC 12.8 (*)    Platelets 452 (*)    Neutro Abs 9.2 (*)    Abs Immature Granulocytes 0.08 (*)    All other components within normal limits  BASIC METABOLIC PANEL - Abnormal; Notable for the following components:   Calcium 8.8 (*)    All other components within normal limits  URINALYSIS, COMPLETE (UACMP) WITH  MICROSCOPIC - Abnormal; Notable for the following components:   Color, Urine STRAW (*)    APPearance CLEAR (*)    All other components within normal limits  SEDIMENTATION RATE - Abnormal; Notable for the following components:   Sed Rate 62 (*)    All other components within normal limits  SEDIMENTATION RATE    RADIOLOGY   Official radiology report(s): Dg Shoulder Right  Result Date: 11/03/2018 CLINICAL DATA:  Right shoulder pain.  No known injury. EXAM: RIGHT SHOULDER - 2+ VIEW COMPARISON:  None. FINDINGS: There is no evidence of fracture or dislocation. There is no evidence of arthropathy or other focal bone abnormality. Soft tissues are unremarkable. IMPRESSION: Negative. Electronically Signed   By: Marisue Brooklyn.D.  On: 11/03/2018 16:43   Dg Shoulder Left  Result Date: 11/03/2018 CLINICAL DATA:  Left shoulder pain.  No known injury. EXAM: LEFT SHOULDER - 2+ VIEW COMPARISON:  None. FINDINGS: There is no evidence of fracture or dislocation. There is no evidence of arthropathy or other focal bone abnormality. Degenerative changes noted within the visualized lower cervical spine. Soft tissues are unremarkable. IMPRESSION: Negative. Electronically Signed   By: Davina Poke M.D.   On: 11/03/2018 16:41    ____________________________________________   PROCEDURES  Procedure(s) performed (including Critical Care):  Procedures ____________________________________________   INITIAL IMPRESSION / ASSESSMENT AND PLAN / ED COURSE  As part of my medical decision making, I reviewed the following data within the electronic MEDICAL RECORD NUMBER Notes from prior ED visits and [place] Controlled Substance Database  84 year old female presents to the ED with complaint of general joint discomfort that has been going on for approximately 3 weeks.  Patient states that she is very active in her yard doing a lot of yard work and has been Freight forwarder and also climbed on a ladder to use a pair of pruning  shears to a tree.  There was no history of fall.  Patient states that over the next several days she became very sore and stiff in her shoulders.  She later developed some soreness and stiffness both in her back and bilateral knees and ankles.  Patient denies any previous arthritic complaints.  While waiting for lab work and x-ray results patient was given tramadol which she states has improved her comfort.  Patient was noted while in the ED to walk to the restroom unassisted without any difficulty.  X-rays of the shoulders were done due to daughter's concerns.  Degenerative changes was noted more on the left than the right.  There was also degenerative changes noted on the cervical spine.  Daughter states that she will follow-up with her mother's PCP.  Patient was given a prescription for tramadol 1 every 8 hours if needed.  She was made aware that she should use a walker or cane for increased balance and protection from falling if taking this medication.  Patient states that she has canes in her basement and she was encouraged to get these.  We also had a discussion on climbing ladders and doing things that would increase her risk for severe injury such as a fractured hip. ____________________________________________   FINAL CLINICAL IMPRESSION(S) / ED DIAGNOSES  Final diagnoses:  Arthralgia, unspecified joint     ED Discharge Orders         Ordered    traMADol (ULTRAM) 50 MG tablet  Every 8 hours PRN     11/03/18 1707           Note:  This document was prepared using Dragon voice recognition software and may include unintentional dictation errors.    Johnn Hai, PA-C 11/03/18 1755    Carrie Mew, MD 11/04/18 1349

## 2018-11-03 NOTE — ED Notes (Signed)
See triage note  Presents with general joint discpmfort  States is has been going on for about 2 months

## 2018-11-03 NOTE — ED Triage Notes (Signed)
Pt to ED via POV c/o joint pain. Pt reports having trouble getting up and down due to the pain. Pt states that she thinks that this is coming from when she was on a ladder topping a tree. Pt states that after that she started having pain in her left shoulder and then the pain started in all her other joints. Pt denies fever or chills. No other symptoms at this time.

## 2018-11-14 ENCOUNTER — Other Ambulatory Visit: Payer: Self-pay | Admitting: Rheumatology

## 2018-11-14 ENCOUNTER — Other Ambulatory Visit: Payer: Self-pay

## 2018-11-14 ENCOUNTER — Ambulatory Visit
Admission: RE | Admit: 2018-11-14 | Discharge: 2018-11-14 | Disposition: A | Discharge status: Home / Self Care | Payer: Medicare Other | Source: Ambulatory Visit | Attending: Rheumatology | Admitting: Rheumatology

## 2018-11-14 DIAGNOSIS — R600 Localized edema: Secondary | ICD-10-CM

## 2019-01-15 ENCOUNTER — Other Ambulatory Visit: Payer: Self-pay | Admitting: Internal Medicine

## 2019-01-15 DIAGNOSIS — Z1231 Encounter for screening mammogram for malignant neoplasm of breast: Secondary | ICD-10-CM

## 2019-02-20 ENCOUNTER — Ambulatory Visit
Admission: RE | Admit: 2019-02-20 | Discharge: 2019-02-20 | Disposition: A | Discharge status: Home / Self Care | Payer: Medicare Other | Source: Ambulatory Visit | Attending: Internal Medicine | Admitting: Internal Medicine

## 2019-02-20 DIAGNOSIS — Z1231 Encounter for screening mammogram for malignant neoplasm of breast: Secondary | ICD-10-CM | POA: Insufficient documentation

## 2019-02-25 ENCOUNTER — Other Ambulatory Visit: Payer: Self-pay | Admitting: Internal Medicine

## 2019-02-25 DIAGNOSIS — N6320 Unspecified lump in the left breast, unspecified quadrant: Secondary | ICD-10-CM

## 2019-02-25 DIAGNOSIS — R928 Other abnormal and inconclusive findings on diagnostic imaging of breast: Secondary | ICD-10-CM

## 2019-03-06 ENCOUNTER — Ambulatory Visit: Payer: Medicare Other | Attending: Rheumatology | Admitting: Occupational Therapy

## 2019-03-06 ENCOUNTER — Other Ambulatory Visit: Payer: Self-pay

## 2019-03-06 DIAGNOSIS — M6281 Muscle weakness (generalized): Secondary | ICD-10-CM | POA: Diagnosis present

## 2019-03-06 DIAGNOSIS — M25542 Pain in joints of left hand: Secondary | ICD-10-CM | POA: Insufficient documentation

## 2019-03-06 DIAGNOSIS — M25641 Stiffness of right hand, not elsewhere classified: Secondary | ICD-10-CM | POA: Insufficient documentation

## 2019-03-06 DIAGNOSIS — M25541 Pain in joints of right hand: Secondary | ICD-10-CM | POA: Diagnosis present

## 2019-03-06 DIAGNOSIS — R278 Other lack of coordination: Secondary | ICD-10-CM | POA: Insufficient documentation

## 2019-03-06 DIAGNOSIS — M25642 Stiffness of left hand, not elsewhere classified: Secondary | ICD-10-CM

## 2019-03-09 ENCOUNTER — Other Ambulatory Visit: Payer: Medicare Other

## 2019-03-09 ENCOUNTER — Ambulatory Visit: Payer: Medicare Other

## 2019-03-12 ENCOUNTER — Other Ambulatory Visit: Payer: Self-pay

## 2019-03-12 ENCOUNTER — Ambulatory Visit: Payer: Medicare Other | Admitting: Occupational Therapy

## 2019-03-12 DIAGNOSIS — M25542 Pain in joints of left hand: Secondary | ICD-10-CM

## 2019-03-12 DIAGNOSIS — M25642 Stiffness of left hand, not elsewhere classified: Secondary | ICD-10-CM

## 2019-03-12 DIAGNOSIS — M25641 Stiffness of right hand, not elsewhere classified: Secondary | ICD-10-CM

## 2019-03-12 DIAGNOSIS — M25541 Pain in joints of right hand: Secondary | ICD-10-CM

## 2019-03-12 DIAGNOSIS — R278 Other lack of coordination: Secondary | ICD-10-CM

## 2019-03-12 DIAGNOSIS — M6281 Muscle weakness (generalized): Secondary | ICD-10-CM

## 2019-03-13 ENCOUNTER — Encounter: Payer: Self-pay | Admitting: Occupational Therapy

## 2019-03-13 NOTE — Therapy (Signed)
[facility] [address] [place], [address] Phone: [phone]   Fax:  [phone]  Occupational Therapy Treatment  Patient Details  Name: Kaitlyn Bridges MRN: [id number] Date of Birth: [birth year] Referring Provider (OT): Cindie Laroche Jefm Bryant   Encounter Date: 03/12/2019  OT End of Session - 03/13/19 1534    Visit Number  2    Number of Visits  6    Date for OT Re-Evaluation  04/17/19    Authorization Type  Medicare    OT Start Time  1601    OT Stop Time  1648    OT Time Calculation (min)  47 min    Activity Tolerance  Patient tolerated treatment well    Behavior During Therapy  [facility] for tasks assessed/performed       Past Medical History:  Diagnosis Date  . Colon polyps   . Coronary artery disease   . Hyperlipidemia   . Hypothyroidism   . Liver cyst   . Lung nodules   . Osteoporosis   . Recurrent UTI   . Vitamin D deficiency     Past Surgical History:  Procedure Laterality Date  . ABDOMINAL HYSTERECTOMY    . BLADDER SURGERY    . COLONOSCOPY    . COLONOSCOPY WITH PROPOFOL N/A 11/16/2015   Procedure: COLONOSCOPY WITH PROPOFOL;  Surgeon: Manya Silvas, MD;  Location: [facility] ENDOSCOPY;  Service: Endoscopy;  Laterality: N/A;  . ESOPHAGOGASTRODUODENOSCOPY (EGD) WITH PROPOFOL N/A 11/16/2015   Procedure: ESOPHAGOGASTRODUODENOSCOPY (EGD) WITH PROPOFOL;  Surgeon: Manya Silvas, MD;  Location: [facility] ENDOSCOPY;  Service: Endoscopy;  Laterality: N/A;  . OOPHORECTOMY      There were no vitals filed for this visit.  Subjective Assessment - 03/13/19 1533    Subjective   Patient reports her hands are much better today than last week.  She has been doing contrast at home followed by tendon gliding exercises.    Pertinent History  Patient's chart indicates RA with joint flaring, worse on left than right with onset appearing after 2nd COVID vaccination.  When discussing with patient today, she is not sure if the  onset was prior to vaccinations or after.  She reports she had an excerbation of symptoms of RA back in October 2020 but then again more recently within the last month.  She had her mammogram a couple days after the vaccine and report noted enlarged lymph nodes.    Patient Stated Goals  Patient reports she wants to be as independent as possible with all tasks and would like to be able to do yardwork.    Currently in Pain?  No/denies    Pain Score  0-No pain       Following contrast, patient seen for ROM exercises with tendon gliding exercises with both right and left hands greater emphasis on left than right.  Patient able to demonstrate composite fisting this date on the left and right.    Patient instructed on and handouts issued regarding energy conservation techniques and joint protection principles for self care, homemaking and with yardwork.  Patient able to demonstrate understanding and will implement strategies over the next week.  Recommended patient take notice of things she is attempting to perform at home that gives her difficulty and make a list to address next session.   This session we discussed larger grip handles on tools, ways to avoid twisting and turning, modification of activities such as handling mulch bags, watering flowers, etc.  Patient instructed on ice massage for use if she has tenderness over A 1 pulley.  No tenderness noted this date.               OT Treatments/Exercises (OP) - 03/13/19 0001      LUE Contrast Bath   Time  11 minutes    Comments  prior to ROM and exercises to decrease edema and pain             OT Education - 03/13/19 1534    Education Details  ice massage, continued contrast bath and HEP    Person(s) Educated  Patient    Methods  Explanation;Demonstration    Comprehension  Verbalized understanding;Returned demonstration          OT Long Term Goals - 03/13/19 1522      OT LONG TERM GOAL #1   Title  Pt to be  independent in HEP to decrease stiffness, pain and swelling in bilateral hands to less than 3/10 at the worst    Baseline  9/10 pain at eval    Time  6    Period  Weeks    Status  New    Target Date  04/17/19      OT LONG TERM GOAL #2   Title  Pt to verbalize 3 joint protection principles to decrease pain and stiffness and increase ease use of hands in ADL's    Baseline  no current knowledge at eval    Time  6    Period  Weeks    Status  New    Target Date  04/17/19      OT LONG TERM GOAL #3   Title  Grip strength increase with more than 5 lbs to grip objects better and without increase symptoms    Baseline  decreased grip bilaterally at eval    Time  6    Period  Weeks    Status  New    Target Date  04/17/19      OT LONG TERM GOAL #4   Title  Patient will demonstrate understanding of modifications to yardwork and identify when tasks are too difficult and options for assist.    Baseline  pt lives alone and highly active in her yard    Time  6    Period  Weeks    Status  New    Target Date  04/17/19            Plan - 03/13/19 1535    Clinical Impression Statement  Patient was able to implement use of contrast and exercises over this last week and pain reduced from 9/10 to 0 today.  No tenderness over A1 pulley this visit, some edema still noted in MPs and recommend patient continue with contrast and tendon glides, use ice massage if needed.  Patient to implement energy conservation and joint protection principles over the next week and will reassess.  Patient able to complete composite fist on the left today with increased ROM.  Will plan to see patient again in 2 weeks.    OT Occupational Profile and History  Detailed Assessment- Review of Records and additional review of physical, cognitive, psychosocial history related to current functional performance    Occupational performance deficits (Please refer to evaluation for details):  ADL's;IADL's;Social Participation;Leisure     Body Structure / Function / Physical Skills  ADL;Coordination;Endurance;GMC;UE functional use;Decreased knowledge of use of DME;Flexibility;IADL;Pain;Dexterity;FMC;Strength;Edema;ROM    Psychosocial Skills  Environmental  Adaptations;Habits;Routines and Behaviors  Rehab Potential  Good    Clinical Decision Making  Limited treatment options, no task modification necessary    Comorbidities Affecting Occupational Performance:  May have comorbidities impacting occupational performance    Modification or Assistance to Complete Evaluation   No modification of tasks or assist necessary to complete eval    OT Frequency  1x / week    OT Duration  8 weeks    OT Treatment/Interventions  Self-care/ADL training;Cryotherapy;Paraffin;Therapeutic exercise;DME and/or AE instruction;Ultrasound;Fluidtherapy;Neuromuscular education;Manual Therapy;Splinting;Moist Heat;Iontophoresis;Contrast Bath;Energy conservation;Passive range of motion;Therapeutic activities;Patient/family education    Consulted and Agree with Plan of Care  Patient       Patient will benefit from skilled therapeutic intervention in order to improve the following deficits and impairments:   Body Structure / Function / Physical Skills: ADL, Coordination, Endurance, GMC, UE functional use, Decreased knowledge of use of DME, Flexibility, IADL, Pain, Dexterity, FMC, Strength, Edema, ROM   Psychosocial Skills: Environmental  Adaptations, Habits, Routines and Behaviors   Visit Diagnosis: Pain in joint of left hand  Pain in joint of right hand  Muscle weakness (generalized)  Other lack of coordination  Stiffness of left hand, not elsewhere classified  Stiffness of right hand, not elsewhere classified    Problem List There are no problems to display for this patient.  Jordayn Mink Oneita Jolly, OTR/L, CLT  Yarissa Reining 03/13/2019, 3:58 PM  [facility] [address]  [place], [address] Phone: [phone]   Fax:  [phone]  Name: SHAQUITTA DIMOS MRN: [id number] Date of Birth: August 14, 1934

## 2019-03-13 NOTE — Therapy (Signed)
[facility] [address], [place], [address] Phone: [phone]   Fax:  [phone]  Occupational Therapy Evaluation  Patient Details  Name: Kaitlyn Bridges MRN: [id number] Date of Birth: March 08, 1934 Referring Provider (OT): Merita Norton   Encounter Date: 03/06/2019  OT End of Session - 03/12/19 1454    Visit Number  1    Number of Visits  6    Date for OT Re-Evaluation  04/17/19    Authorization Type  Medicare    OT Start Time  1009    OT Stop Time  1105    OT Time Calculation (min)  56 min    Activity Tolerance  Patient tolerated treatment well    Behavior During Therapy  [facility] for tasks assessed/performed       Past Medical History:  Diagnosis Date  . Colon polyps   . Coronary artery disease   . Hyperlipidemia   . Hypothyroidism   . Liver cyst   . Lung nodules   . Osteoporosis   . Recurrent UTI   . Vitamin D deficiency     Past Surgical History:  Procedure Laterality Date  . ABDOMINAL HYSTERECTOMY    . BLADDER SURGERY    . COLONOSCOPY    . COLONOSCOPY WITH PROPOFOL N/A 11/16/2015   Procedure: COLONOSCOPY WITH PROPOFOL;  Surgeon: Manya Silvas, MD;  Location: [facility] ENDOSCOPY;  Service: Endoscopy;  Laterality: N/A;  . ESOPHAGOGASTRODUODENOSCOPY (EGD) WITH PROPOFOL N/A 11/16/2015   Procedure: ESOPHAGOGASTRODUODENOSCOPY (EGD) WITH PROPOFOL;  Surgeon: Manya Silvas, MD;  Location: [facility] ENDOSCOPY;  Service: Endoscopy;  Laterality: N/A;  . OOPHORECTOMY      There were no vitals filed for this visit.  Subjective Assessment - 03/12/19 1452    Subjective   Patient reports progressive weakness and pain in bilateral hands since around October 02, 2018 with more recent exacerbation of symptoms about one month ago. Patient reports this affects her daily activities and has increased pain.  Reports she lives alone and daughter is about 3 hours away therefore she needs to be as independent as possible.    Pertinent History  Patient's chart indicates RA with joint flaring, worse on left than right with onset appearing after 2nd COVID vaccination.  When discussing with patient today, she is not sure if the onset was prior to vaccinations or after.  She reports she had an excerbation of symptoms of RA back in October 2020 but then again more recently within the last month.  She had her mammogram a couple days after the vaccine and report noted enlarged lymph nodes.    Patient Stated Goals  Patient reports she wants to be as independent as possible with all tasks and would like to be able to do yardwork.    Currently in Pain?  Yes    Pain Score  9     Pain Location  Hand    Pain Orientation  Left    Pain Descriptors / Indicators  Aching    Pain Type  Chronic pain    Pain Onset  1 to 4 weeks ago    Pain Frequency  Constant    Pain Onset  1 to 4 weeks ago        [facility] OT Assessment - 03/13/19 1457      Assessment   Medical Diagnosis  RA pain in bilateral hands    Referring Provider (OT)  Elida  Right  Prior Therapy  no      Balance Screen   Has the patient fallen in the past 6 months  No      Home  Environment   Family/patient expects to be discharged to:  Private residence    Living Arrangements  Alone    Available Help at Discharge  Family    Type of [place]  One level    Lives With  Alone      Prior Function   Level of [place]  Retired    Leisure  yardwork      ADL   Eating/Feeding  Modified independent    Grooming  Modified independent    Upper Body Bathing  Modified independent    Lower Body Bathing  Modified independent    Upper Body Dressing  Increased time    Lower Body Dressing  Modified independent    Toilet Transfer  Modified independent    [place]  Increased time    Hand Transfer  Modified independent    ADL  comments  Patient reports she now has to hold her cup with both hands to support to bring to mouth, she has difficulty with bra donning and doffing. She requires increased time for all self-care activities. She tends to avoid buttons since they are more difficult now, she is able to perform zippers with increased effort and increased time allowed for task. She has difficulty bending over to pick up objects and she has increased difficulty with opening jars and containers. Prior to this she was very active with performing yardwork  including tasks such as managing bags of mulch.  Patient reports difficulty when shopping to pick up heavy objects especially items such as a six pack of tea, or a case of water.       IADL   Prior Level of Function Shopping  independent    Shopping  Shops independently for small purchases    Prior Level of Function Light Housekeeping  independent    Light Housekeeping  Performs light daily tasks such as dishwashing, bed making    Prior Level of Function Meal Prep  independent    Meal Prep  Able to complete simple warm meal prep    Prior Level of Function Metallurgist own vehicle    Prior Level of Function Medication Managment  independent    Medication Management  Is responsible for taking medication in correct dosages at correct time    Prior Level of Function Loss adjuster, chartered financial matters independently (budgets, writes checks, pays rent, bills goes to bank), collects and keeps track of income      Mobility   Mobility Status  Independent      Written Expression   Dominant Hand  Right      Sensation   Light Touch  Appears Intact    Stereognosis  Appears Intact    Hot/Cold  Appears Intact    Proprioception  Appears Intact      Coordination   Gross Motor Movements are Fluid and Coordinated  Yes    Fine Motor Movements are Fluid and Coordinated  No       Edema   Edema  edema present in bilateral hands      ROM /  Strength   AROM / PROM / Strength  AROM;Strength      AROM   Overall AROM   Deficits    Right Forearm Pronation  80 Degrees    Right Forearm Supination  80 Degrees    Left Forearm Pronation  80 Degrees    Left Forearm Supination  70 Degrees    Right/Left Wrist  Left    Right Wrist Extension  50 Degrees    Right Wrist Flexion  55 Degrees    Left Wrist Extension  32 Degrees    Left Wrist Flexion  55 Degrees      Strength   Overall Strength  Deficits    Right Hand Grip (lbs)  7    Right Hand Lateral Pinch  8 lbs    Right Hand 3 Point Pinch  8 lbs    Left Hand Grip (lbs)  3    Left Hand Lateral Pinch  4 lbs    Left Hand 3 Point Pinch  4 lbs      Right Hand AROM   R Thumb IP 0-80  60 Degrees    R Index  MCP 0-90  75 Degrees   -10 ext   R Index PIP 0-100  75 Degrees    R Long  MCP 0-90  80 Degrees   -10 ext   R Long PIP 0-100  75 Degrees    R Ring  MCP 0-90  80 Degrees   -10 ext   R Ring PIP 0-100  75 Degrees    R Little  MCP 0-90  80 Degrees   -10 ext   R Little PIP 0-100  80 Degrees      Left Hand AROM   L Thumb MCP 0-60  50 Degrees    L Thumb IP 0-80  50 Degrees    L Index  MCP 0-90  65 Degrees   -25 MP ext   L Index PIP 0-100  65 Degrees    L Long  MCP 0-90  67 Degrees   -30 exr   L Long PIP 0-100  75 Degrees    L Ring  MCP 0-90  80 Degrees   -10 ext   L Ring PIP 0-100  75 Degrees    L Little  MCP 0-90  80 Degrees   -5 ext   L Little PIP 0-100  80 Degrees       Right:  Patient has full opposition on the right hand with full fisting , pain with index and long finger with flexion.   Left: Left UE worse than right, increased tenderness over the A1 pulley Partial fisting decreased flexion at index and long finger, ring and small almost to palm Opposition to small finger with increased effort, increased pain to 9/10 and unable to oppose thumb to base of small finger.    Pain with supination Edema  present dorsally at index and long finger around MP joints.     Patient reports hand pain and ROM worsened about 1 month ago and may be related to COVID vaccine.                    OT Education - 03/12/19 1454    Education Details  findings of eval, plan of care, home exercises, contrast bath    Person(s) Educated  Patient    Methods  Explanation;Demonstration    Comprehension  Verbalized understanding;Returned demonstration          OT  Long Term Goals - 03/13/19 1522      OT LONG TERM GOAL #1   Title  Pt to be independent in HEP to decrease stiffness, pain and swelling in bilateral hands to less than 3/10 at the worst    Baseline  9/10 pain at eval    Time  6    Period  Weeks    Status  New    Target Date  04/17/19      OT LONG TERM GOAL #2   Title  Pt to verbalize 3 joint protection principles to decrease pain and stiffness and increase ease use of hands in ADL's    Baseline  no current knowledge at eval    Time  6    Period  Weeks    Status  New    Target Date  04/17/19      OT LONG TERM GOAL #3   Title  Grip strength increase with more than 5 lbs to grip objects better and without increase symptoms    Baseline  decreased grip bilaterally at eval    Time  6    Period  Weeks    Status  New    Target Date  04/17/19      OT LONG TERM GOAL #4   Title  Patient will demonstrate understanding of modifications to yardwork and identify when tasks are too difficult and options for assist.    Baseline  pt lives alone and highly active in her yard    Time  6    Period  Weeks    Status  New    Target Date  04/17/19            Plan - 03/12/19 1454    Clinical Impression Statement  Patient is an 85 year old female with a history of rheumatoid arthritis who had a recent exacerbation of symptoms and was referred to occupational therapy for evaluation.  She lives alone and has been previously independent with all tasks including being very active and  including extensive yardwork. She presents with bilateral hand pain with left hand worse than right, decreased grip strength, muscle weakness, decreased pinch skills, pain, and decreased ability to perform daily tasks. Patient would benefit from skilled occupational therapy to maximize safety and independence in ADL and IADL tasks to allow her to continue to live independently in the community.    OT Occupational Profile and History  Detailed Assessment- Review of Records and additional review of physical, cognitive, psychosocial history related to current functional performance    Occupational performance deficits (Please refer to evaluation for details):  ADL's;IADL's;Social Participation;Leisure    Body Structure / Function / Physical Skills  ADL;Coordination;Endurance;GMC;UE functional use;Decreased knowledge of use of DME;Flexibility;IADL;Pain;Dexterity;FMC;Strength;Edema;ROM    Psychosocial Skills  Environmental  Adaptations;Habits;Routines and Behaviors    Rehab Potential  Good    Clinical Decision Making  Limited treatment options, no task modification necessary    Comorbidities Affecting Occupational Performance:  May have comorbidities impacting occupational performance    Modification or Assistance to Complete Evaluation   No modification of tasks or assist necessary to complete eval    OT Frequency  1x / week    OT Duration  8 weeks    OT Treatment/Interventions  Self-care/ADL training;Cryotherapy;Paraffin;Therapeutic exercise;DME and/or AE instruction;Ultrasound;Fluidtherapy;Neuromuscular education;Manual Therapy;Splinting;Moist Heat;Iontophoresis;Contrast Bath;Energy conservation;Passive range of motion;Therapeutic activities;Patient/family education    OT Home Exercise Plan  Patient educated on contrast bath for edema management, tendon gliding exercises for ROM    Consulted and  Agree with Plan of Care  Patient       Patient will benefit from skilled therapeutic intervention in order  to improve the following deficits and impairments:   Body Structure / Function / Physical Skills: ADL, Coordination, Endurance, GMC, UE functional use, Decreased knowledge of use of DME, Flexibility, IADL, Pain, Dexterity, FMC, Strength, Edema, ROM   Psychosocial Skills: Environmental  Adaptations, Habits, Routines and Behaviors   Visit Diagnosis: Pain in joint of left hand  Pain in joint of right hand  Muscle weakness (generalized)  Other lack of coordination  Stiffness of left hand, not elsewhere classified  Stiffness of right hand, not elsewhere classified    Problem List There are no problems to display for this patient.  Tynesha Free Oneita Jolly, OTR/L, CLT  Audie Wieser 03/13/2019, 3:28 PM  [facility] [address] [place], [address] Phone: [phone]   Fax:  [phone]  Name: Kaitlyn Bridges MRN: [id number] Date of Birth: 1934-07-26

## 2019-03-19 ENCOUNTER — Ambulatory Visit: Payer: Medicare Other | Admitting: Occupational Therapy

## 2019-03-23 ENCOUNTER — Ambulatory Visit
Admission: RE | Admit: 2019-03-23 | Discharge: 2019-03-23 | Disposition: A | Discharge status: Home / Self Care | Payer: Medicare Other | Source: Ambulatory Visit | Attending: Internal Medicine | Admitting: Internal Medicine

## 2019-03-23 DIAGNOSIS — N6320 Unspecified lump in the left breast, unspecified quadrant: Secondary | ICD-10-CM

## 2019-03-23 DIAGNOSIS — R928 Other abnormal and inconclusive findings on diagnostic imaging of breast: Secondary | ICD-10-CM

## 2019-03-26 ENCOUNTER — Other Ambulatory Visit: Payer: Self-pay

## 2019-03-26 ENCOUNTER — Ambulatory Visit: Payer: Medicare Other | Admitting: Occupational Therapy

## 2019-03-26 ENCOUNTER — Encounter: Payer: Self-pay | Admitting: Occupational Therapy

## 2019-03-26 DIAGNOSIS — M25641 Stiffness of right hand, not elsewhere classified: Secondary | ICD-10-CM

## 2019-03-26 DIAGNOSIS — R278 Other lack of coordination: Secondary | ICD-10-CM

## 2019-03-26 DIAGNOSIS — M6281 Muscle weakness (generalized): Secondary | ICD-10-CM

## 2019-03-26 DIAGNOSIS — M25542 Pain in joints of left hand: Secondary | ICD-10-CM

## 2019-03-26 DIAGNOSIS — M25541 Pain in joints of right hand: Secondary | ICD-10-CM

## 2019-03-26 DIAGNOSIS — M25642 Stiffness of left hand, not elsewhere classified: Secondary | ICD-10-CM

## 2019-03-27 NOTE — Therapy (Signed)
[facility] [address], [place], [address] Phone: [phone]   Fax:  [phone]  Occupational Therapy Treatment/Discharge Summary   Patient Details  Name: Kaitlyn Bridges MRN: [id number] Date of Birth: 06-22-1934 Referring Provider (OT): Merita Norton   Encounter Date: 03/26/2019  OT End of Session - 03/27/19 1133    Visit Number  3    Number of Visits  6    Date for OT Re-Evaluation  04/17/19    Authorization Type  Medicare    OT Start Time  1600    OT Stop Time  1650    OT Time Calculation (min)  50 min    Activity Tolerance  Patient tolerated treatment well    Behavior During Therapy  [facility] for tasks assessed/performed       Past Medical History:  Diagnosis Date  . Colon polyps   . Coronary artery disease   . Hyperlipidemia   . Hypothyroidism   . Liver cyst   . Lung nodules   . Osteoporosis   . Recurrent UTI   . Vitamin D deficiency     Past Surgical History:  Procedure Laterality Date  . ABDOMINAL HYSTERECTOMY    . BLADDER SURGERY    . COLONOSCOPY    . COLONOSCOPY WITH PROPOFOL N/A 11/16/2015   Procedure: COLONOSCOPY WITH PROPOFOL;  Surgeon: Manya Silvas, MD;  Location: [facility] ENDOSCOPY;  Service: Endoscopy;  Laterality: N/A;  . ESOPHAGOGASTRODUODENOSCOPY (EGD) WITH PROPOFOL N/A 11/16/2015   Procedure: ESOPHAGOGASTRODUODENOSCOPY (EGD) WITH PROPOFOL;  Surgeon: Manya Silvas, MD;  Location: [facility] ENDOSCOPY;  Service: Endoscopy;  Laterality: N/A;  . OOPHORECTOMY      There were no vitals filed for this visit.  Subjective Assessment - 03/26/19 1621    Subjective   Has been trying to do yardwork and put out mulch, someone helped her to place the bags and she worked a little at a time.  She reports she has not been as consistent with doing contrast for edema.    Pertinent History  Patient's chart indicates RA with joint flaring, worse on left than right with onset appearing after 2nd COVID  vaccination.  When discussing with patient today, she is not sure if the onset was prior to vaccinations or after.  She reports she had an excerbation of symptoms of RA back in October 2020 but then again more recently within the last month.  She had her mammogram a couple days after the vaccine and report noted enlarged lymph nodes.    Patient Stated Goals  Patient reports she wants to be as independent as possible with all tasks and would like to be able to do yardwork.    Currently in Pain?  No/denies    Pain Score  0-No pain         [facility] OT Assessment - 03/26/19 1608      AROM   Left Wrist Extension  42 Degrees    Left Wrist Flexion  55 Degrees      Strength   Right Hand Grip (lbs)  32   35 after paraffin   Right Hand Lateral Pinch  13 lbs    Right Hand 3 Point Pinch  12 lbs    Left Hand Grip (lbs)  19   22 after paraffin   Left Hand Lateral Pinch  11 lbs    Left Hand 3 Point Pinch  8 lbs      Right Hand AROM  R Thumb IP 0-80  65 Degrees    R Index  MCP 0-90  85 Degrees    R Long  MCP 0-90  85 Degrees    R Ring  MCP 0-90  85 Degrees    R Little  MCP 0-90  85 Degrees      Left Hand AROM   L Thumb IP 0-80  60 Degrees    L Index  MCP 0-90  85 Degrees    L Index PIP 0-100  90 Degrees    L Long  MCP 0-90  80 Degrees    L Long PIP 0-100  90 Degrees    L Ring  MCP 0-90  80 Degrees    L Ring PIP 0-100  90 Degrees    L Little  MCP 0-90  80 Degrees    L Little PIP 0-100  90 Degrees       Reassessment of grip, pinch, ROM and strength this date, see flow sheet for details.  Goals updated to reflect progress.   Paraffin bath to bilateral UEs prior to ROM and exercises to increase motion, decrease pain and inflammation.   Patient seen for left hand ROM, tendon gliding exercises with review of exercises for home program.  Recommend patient continue with contrast for edema control, use of heat for stiffness and performance of exercises after heat.   Reinstruction of joint  protection principles and the need to modify the way she performs daily tasks especially with her yard work.  Recommend built up handles on tools, performing tasks in short periods of time, dividing large packages into smaller units to manage.  Patient demonstrates understanding.    Instructed on home unit for paraffin bath with options to purchase online from [place] or [email or web address]  Discussed need to be consistent with management of symptoms for long term effectiveness.    Response to tx:   Patient has made excellent progress, decreased edema, decreased pain and increased ROM, grip and pinch skills.  Patient is independent in home program and goals have been met.  No further OT needs at this time, reconsult as needs arise.                     OT Education - 03/27/19 1132    Education Details  ice massage, continued contrast bath and HEP, joint protection principles    Person(s) Educated  Patient    Methods  Explanation;Demonstration    Comprehension  Verbalized understanding;Returned demonstration          OT Long Term Goals - 03/27/19 1134      OT LONG TERM GOAL #1   Title  Pt to be independent in HEP to decrease stiffness, pain and swelling in bilateral hands to less than 3/10 at the worst    Baseline  9/10 pain at eval    Time  6    Period  Weeks    Status  Achieved      OT LONG TERM GOAL #2   Title  Pt to verbalize 3 joint protection principles to decrease pain and stiffness and increase ease use of hands in ADL's    Baseline  no current knowledge at eval    Time  6    Period  Weeks    Status  Achieved      OT LONG TERM GOAL #3   Title  Grip strength increase with more than 5 lbs to grip objects  better and without increase symptoms    Baseline  decreased grip bilaterally at eval    Time  6     Period  Weeks    Status  Achieved      OT LONG TERM GOAL #4   Title  Patient will demonstrate understanding of modifications to yardwork and identify when tasks are too difficult and options for assist.    Baseline  pt lives alone and highly active in her yard    Time  6    Period  Weeks    Status  Achieved            Plan - 03/27/19 1134    Clinical Impression Statement  Patient has made excellent progress, decreased edema, decreased pain and increased ROM, grip and pinch skills.  Patient is independent in home program and goals have been met.  No further OT needs at this time, reconsult as needs arise.    OT Occupational Profile and History  Detailed Assessment- Review of Records and additional review of physical, cognitive, psychosocial history related to current functional performance    Occupational performance deficits (Please refer to evaluation for details):  ADL's;IADL's;Social Participation;Leisure    Body Structure / Function / Physical Skills  ADL;Coordination;Endurance;GMC;UE functional use;Decreased knowledge of use of DME;Flexibility;IADL;Pain;Dexterity;FMC;Strength;Edema;ROM    Psychosocial Skills  Environmental  Adaptations;Habits;Routines and Behaviors    Rehab Potential  Good    Clinical Decision Making  Limited treatment options, no task modification necessary    Comorbidities Affecting Occupational Performance:  May have comorbidities impacting occupational performance    Modification or Assistance to Complete Evaluation   No modification of tasks or assist necessary to complete eval    OT Frequency  1x / week    OT Duration  8 weeks    OT Treatment/Interventions  Self-care/ADL training;Cryotherapy;Paraffin;Therapeutic exercise;DME and/or AE instruction;Ultrasound;Fluidtherapy;Neuromuscular education;Manual Therapy;Splinting;Moist Heat;Iontophoresis;Contrast Bath;Energy conservation;Passive range of motion;Therapeutic activities;Patient/family education    Consulted  and Agree with Plan of Care  Patient       Patient will benefit from skilled therapeutic intervention in order to improve the following deficits and impairments:   Body Structure / Function / Physical Skills: ADL, Coordination, Endurance, GMC, UE functional use, Decreased knowledge of use of DME, Flexibility, IADL, Pain, Dexterity, FMC, Strength, Edema, ROM   Psychosocial Skills: Environmental  Adaptations, Habits, Routines and Behaviors   Visit Diagnosis: Pain in joint of left hand  Pain in joint of right hand  Muscle weakness (generalized)  Other lack of coordination  Stiffness of left hand, not elsewhere classified  Stiffness of right hand, not elsewhere classified    Problem List There are no problems to display for this patient.  Amy T Tomasita Morrow, OTR/L, CLT  Lovett,Amy 03/27/2019, 11:50 AM  [facility] [address], [place], [address] Phone: [phone]   Fax:  [phone]  Name: NORIKO MACARI MRN: [id number] Date of Birth: 19-Feb-1934

## 2019-07-06 ENCOUNTER — Emergency Department: Payer: Medicare Other

## 2019-07-06 ENCOUNTER — Encounter: Payer: Self-pay | Admitting: Emergency Medicine

## 2019-07-06 ENCOUNTER — Other Ambulatory Visit: Payer: Self-pay

## 2019-07-06 ENCOUNTER — Emergency Department
Admission: EM | Admit: 2019-07-06 | Discharge: 2019-07-06 | Disposition: A | Discharge status: Home / Self Care | Payer: Medicare Other | Attending: Emergency Medicine | Admitting: Emergency Medicine

## 2019-07-06 DIAGNOSIS — E039 Hypothyroidism, unspecified: Secondary | ICD-10-CM | POA: Insufficient documentation

## 2019-07-06 DIAGNOSIS — M25532 Pain in left wrist: Secondary | ICD-10-CM | POA: Diagnosis present

## 2019-07-06 DIAGNOSIS — I2510 Atherosclerotic heart disease of native coronary artery without angina pectoris: Secondary | ICD-10-CM | POA: Diagnosis not present

## 2019-07-06 DIAGNOSIS — Z7982 Long term (current) use of aspirin: Secondary | ICD-10-CM | POA: Insufficient documentation

## 2019-07-06 DIAGNOSIS — W19XXXA Unspecified fall, initial encounter: Secondary | ICD-10-CM

## 2019-07-06 DIAGNOSIS — F17210 Nicotine dependence, cigarettes, uncomplicated: Secondary | ICD-10-CM | POA: Insufficient documentation

## 2019-07-06 NOTE — ED Triage Notes (Signed)
Pt was walking down near water to see baby ducks and got stuck in mud and fell attempting to get out. Thinks broke left wrist.

## 2019-07-06 NOTE — Discharge Instructions (Addendum)
Follow-up with your primary care provider.  Ice and elevation to your wrist as needed for pain or swelling.  Continue with your Tylenol at home.  Wear your wrist brace as you have been doing.  Return to the emergency department if any severe worsening of your wrist pain.

## 2019-07-06 NOTE — ED Provider Notes (Signed)
[facility] Emergency Department Provider Note  ____________________________________________   First MD Initiated Contact with Patient 07/06/19 1452     (approximate)  I have reviewed the triage vital signs and the nursing notes.   HISTORY  Chief Complaint Fall   HPI Kaitlyn Bridges is a 85 y.o. female presents to the ED with complaint of left wrist pain.  Patient states that she was walking and slipped on some grass when she went to see the baby ducks.  Patient then got stuck in mud and needed assistance to be pulled out of the mud.  She denies any head injury or loss of consciousness during this injury.  Patient also went home and showered off the mud that was all over her prior to coming to the ED.  She also has a wrist brace on that she is worn in the past and states that the pain has decreased.  Initially she rated her pain as a 7 out of 10 but now states that it is approximately a 3 out of 10.         Past Medical History:  Diagnosis Date  . Colon polyps   . Coronary artery disease   . Hyperlipidemia   . Hypothyroidism   . Liver cyst   . Lung nodules   . Osteoporosis   . Recurrent UTI   . Vitamin D deficiency     There are no problems to display for this patient.   Past Surgical History:  Procedure Laterality Date  . ABDOMINAL HYSTERECTOMY    . BLADDER SURGERY    . COLONOSCOPY    . COLONOSCOPY WITH PROPOFOL N/A 11/16/2015   Procedure: COLONOSCOPY WITH PROPOFOL;  Surgeon: Manya Silvas, MD;  [facility] END[facility]COPY;  Service: Endoscopy;  Laterality: N/A;  . ESOPHAGOGASTRODUODENOSCOPY (EGD) WITH PROPOFOL N/A 11/16/2015   Procedure: ESOPHAGOGASTRODUODENOSCOPY (EGD) WITH PROPOFOL;  Surgeon: Manya Silvas, MD;  Location: [facility] ENDOSCOPY;  Service: Endoscopy;  Laterality: N/A;  . OOPHORECTOMY      Prior to Admission medications   Medication Sig Start Date End Date Taking? Authorizing Provider  aspirin EC 81 MG tablet Take 81 mg by  mouth daily.    [provider]  buPROPion (WELLBUTRIN XL) 150 MG 24 hr tablet Take 150 mg by mouth daily.    [provider]  calcium carbonate (OSCAL) 1500 (600 Ca) MG TABS tablet Take by mouth 2 (two) times daily with a meal.    [provider]  levothyroxine (SYNTHROID, LEVOTHROID) 112 MCG tablet Take 112 mcg by mouth daily before breakfast.    [provider]  lovastatin (ALTOPREV) 40 MG 24 hr tablet Take 40 mg by mouth at bedtime.    [provider]  traMADol (ULTRAM) 50 MG tablet Take 1 tablet (50 mg total) by mouth every 8 (eight) hours as needed for moderate pain. 11/03/18   Johnn Hai, PA-C    Allergies Penicillins and Sulfa antibiotics  History reviewed. No pertinent family history.  Social History Social History   Tobacco Use  . Smoking status: Current Every Day Smoker    Packs/day: 0.75    Types: Cigarettes  . Smokeless tobacco: Never Used  Substance Use Topics  . Alcohol use: Yes    Alcohol/week: 7.0 standard drinks    Types: 7 Glasses of wine per week    Comment: weekly  . Drug use: No    Review of Systems Constitutional: No fever/chills Eyes: No visual changes. ENT: No sore  throat. Cardiovascular: Denies chest pain. Respiratory: Denies shortness of breath. Gastrointestinal: No abdominal pain.  No nausea, no vomiting. Musculoskeletal: Positive left wrist pain. Skin: Negative for rash. Neurological: Negative for headaches, focal weakness or numbness. ____________________________________________   PHYSICAL EXAM:  VITAL SIGNS: ED Triage Vitals  Enc Vitals Group     BP 07/06/19 1427 128/79     Pulse Rate 07/06/19 1424 88     Resp 07/06/19 1424 18     Temp 07/06/19 1424 98.5 F (36.9 C)     Temp Source 07/06/19 1424 Oral     SpO2 07/06/19 1424 97 %     Weight 07/06/19 1426 129 lb (58.5 kg)     Height 07/06/19 1426 5\' 1"  (1.549 m)     Head Circumference --      Peak Flow --      Pain Score 07/06/19  1426 7     Pain Loc --      Pain Edu? --      Excl. in [place]? --     Constitutional: Alert and oriented. Well appearing and in no acute distress. Eyes: Conjunctivae are normal. PERRL. EOMI. Head: Atraumatic. Neck: No stridor.   Cardiovascular: Normal rate, regular rhythm. Grossly normal heart sounds.  Good peripheral circulation. Respiratory: Normal respiratory effort.  No retractions. Lungs CTAB. Musculoskeletal: Examination left wrist there is no gross deformity however there is some mild soft tissue edema present.  Skin is intact and no erythema or discoloration is noted.  Patient can move digits distally without any difficulty.  Motor sensory function intact.  Capillary refills less than 3 seconds.  There is some tenderness on palpation of the wrist but patient can flex and extend without any difficulty. Neurologic:  Normal speech and language. No gross focal neurologic deficits are appreciated. No gait instability. Skin:  Skin is warm, dry and intact. No rash noted. Psychiatric: Mood and affect are normal. Speech and behavior are normal.  ____________________________________________   LABS (all labs ordered are listed, but only abnormal results are displayed)  Labs Reviewed - No data to display  RADIOLOGY  Official radiology report(s): DG Wrist Complete Left  Result Date: 07/06/2019 CLINICAL DATA:  Fall, walking and fell with pain in LEFT wrist EXAM: LEFT WRIST - COMPLETE 3+ VIEW COMPARISON:  None. FINDINGS: Osteopenia. Degenerative changes about the wrist particularly in the first carpometacarpal joint and the radiocarpal joints. No sign of acute fracture or dislocation. IMPRESSION: No acute fracture or dislocation. Degenerative changes about the wrist and osteopenia. Electronically Signed   By: Zetta Bills M.D.   On: 07/06/2019 15:13    ____________________________________________   PROCEDURES  Procedure(s) performed (including Critical  Care):  Procedures   ____________________________________________   INITIAL IMPRESSION / ASSESSMENT AND PLAN / ED COURSE  As part of my medical decision making, I reviewed the following data within the electronic MEDICAL RECORD NUMBER Notes from prior ED visits and [place] Controlled Substance Database  85 year old female presents to the ED with complaint of left wrist pain after a fall in which she got stuck in mud.  There was no head injury in this event and patient currently is wearing a wrist splint that she is used in the past.  She states her pain is 3 out of 10 after taking a Tylenol at home and applying the wrist splint.  Physical exam was benign and x-rays were negative for any acute fracture.  Patient was made aware.  She is instructed to use ice and elevation if  needed for swelling.  She will follow-up with her PCP if any continued problems.  ____________________________________________   FINAL CLINICAL IMPRESSION(S) / ED DIAGNOSES  Final diagnoses:  Acute wrist pain, left  Fall, initial encounter     ED Discharge Orders    None       Note:  This document was prepared using Dragon voice recognition software and may include unintentional dictation errors.    Johnn Hai, PA-C 07/06/19 Velta Addison    Harvest Dark, MD 07/06/19 2006

## 2019-07-06 NOTE — ED Notes (Signed)
See triage note  States she fell while trying to feed the ducks  States she got stuck in the mud  Injury to left wrist

## 2019-11-09 ENCOUNTER — Ambulatory Visit (INDEPENDENT_AMBULATORY_CARE_PROVIDER_SITE_OTHER): Payer: Medicare Other | Admitting: Dermatology

## 2019-11-09 ENCOUNTER — Other Ambulatory Visit: Payer: Self-pay

## 2019-11-09 ENCOUNTER — Encounter: Payer: Self-pay | Admitting: Dermatology

## 2019-11-09 DIAGNOSIS — Z1283 Encounter for screening for malignant neoplasm of skin: Secondary | ICD-10-CM | POA: Diagnosis not present

## 2019-11-09 DIAGNOSIS — Z872 Personal history of diseases of the skin and subcutaneous tissue: Secondary | ICD-10-CM

## 2019-11-09 DIAGNOSIS — L578 Other skin changes due to chronic exposure to nonionizing radiation: Secondary | ICD-10-CM

## 2019-11-09 DIAGNOSIS — L821 Other seborrheic keratosis: Secondary | ICD-10-CM

## 2019-11-09 DIAGNOSIS — L820 Inflamed seborrheic keratosis: Secondary | ICD-10-CM

## 2019-11-09 DIAGNOSIS — D692 Other nonthrombocytopenic purpura: Secondary | ICD-10-CM

## 2019-11-09 DIAGNOSIS — L719 Rosacea, unspecified: Secondary | ICD-10-CM | POA: Diagnosis not present

## 2019-11-09 DIAGNOSIS — D229 Melanocytic nevi, unspecified: Secondary | ICD-10-CM

## 2019-11-09 DIAGNOSIS — D485 Neoplasm of uncertain behavior of skin: Secondary | ICD-10-CM

## 2019-11-09 DIAGNOSIS — L570 Actinic keratosis: Secondary | ICD-10-CM | POA: Diagnosis not present

## 2019-11-09 DIAGNOSIS — D1800 Hemangioma unspecified site: Secondary | ICD-10-CM

## 2019-11-09 DIAGNOSIS — L814 Other melanin hyperpigmentation: Secondary | ICD-10-CM | POA: Diagnosis not present

## 2019-11-09 NOTE — Patient Instructions (Signed)

## 2019-11-09 NOTE — Progress Notes (Signed)
Follow-Up Visit   Subjective  Kaitlyn Bridges is a 85 y.o. female who presents for the following: Annual Exam (History of AK - TBSE today). The patient presents for Total-Body Skin Exam (TBSE) for skin cancer screening and mole check.  The following portions of the chart were reviewed this encounter and updated as appropriate:  Tobacco   Allergies   Meds   Problems   Med Hx   Surg Hx   Fam Hx      Review of Systems:  No other skin or systemic complaints except as noted in HPI or Assessment and Plan.  Objective  Well appearing patient in no apparent distress; mood and affect are within normal limits.  A full examination was performed including scalp, head, eyes, ears, nose, lips, neck, chest, axillae, abdomen, back, buttocks, bilateral upper extremities, bilateral lower extremities, hands, feet, fingers, toes, fingernails, and toenails. All findings within normal limits unless otherwise noted below.  Objective  Nasal bridge: Erythematous thin papules/macules with gritty scale.   Objective  Face: Dilated blood vessels  Objective  Right hand x 4, back x 3, right abdomen x 1 (8): Erythematous keratotic or waxy stuck-on papule or plaque.   Objective  Right chest infraclavicular: 1.1 x 0.7 cm pink patch   Assessment & Plan    Purpura - Violaceous macules and patches - Benign - Related to age, sun damage and/or use of blood thinners - Observe - Can use OTC arnica containing moisturizer such as Dermend Bruise Formula if desired - Call for worsening or other concerns  Lentigines - Scattered tan macules - Discussed due to sun exposure - Benign, observe - Call for any changes  Seborrheic Keratoses - Stuck-on, waxy, tan-brown papules and plaques  - Discussed benign etiology and prognosis. - Observe - Call for any changes  Melanocytic Nevi - Tan-brown and/or pink-flesh-colored symmetric macules and papules - Benign appearing on exam today - Observation - Call clinic for  new or changing moles - Recommend daily use of broad spectrum spf 30+ sunscreen to sun-exposed areas.   Hemangiomas - Red papules - Discussed benign nature - Observe - Call for any changes  Actinic Damage - Chronic, secondary to cumulative UV/sun exposure - diffuse scaly erythematous macules with underlying dyspigmentation - Recommend daily broad spectrum sunscreen SPF 30+ to sun-exposed areas, reapply every 2 hours as needed.  - Call for new or changing lesions.  Skin cancer screening performed today.   AK (actinic keratosis) Nasal bridge  Destruction of lesion - Nasal bridge Complexity: simple   Destruction method: cryotherapy   Informed consent: discussed and consent obtained   Timeout:  patient name, date of birth, surgical site, and procedure verified Lesion destroyed using liquid nitrogen: Yes   Region frozen until ice ball extended beyond lesion: Yes   Outcome: patient tolerated procedure well with no complications   Post-procedure details: wound care instructions given    Rosacea Face  Benign. Discussed BBL. Patient declines at this time.  Inflamed seborrheic keratosis (8) Right hand x 4, back x 3, right abdomen x 1  Destruction of lesion - Right hand x 4, back x 3, right abdomen x 1 Complexity: simple   Destruction method: cryotherapy   Informed consent: discussed and consent obtained   Timeout:  patient name, date of birth, surgical site, and procedure verified Lesion destroyed using liquid nitrogen: Yes   Region frozen until ice ball extended beyond lesion: Yes   Outcome: patient tolerated procedure well with no complications  Post-procedure details: wound care instructions given    Neoplasm of uncertain behavior of skin Right chest infraclavicular  Skin / nail biopsy Type of biopsy: tangential   Informed consent: discussed and consent obtained   Timeout: patient name, date of birth, surgical site, and procedure verified   Procedure prep:  Patient  was prepped and draped in usual sterile fashion Prep type:  Isopropyl alcohol Anesthesia: the lesion was anesthetized in a standard fashion   Anesthetic:  1% lidocaine w/ epinephrine 1-100,000 buffered w/ 8.4% NaHCO3 Instrument used: flexible razor blade   Hemostasis achieved with: pressure, aluminum chloride and electrodesiccation   Outcome: patient tolerated procedure well   Post-procedure details: sterile dressing applied and wound care instructions given   Dressing type: bandage and petrolatum    Specimen 1 - Surgical pathology Differential Diagnosis: BCC vs other  Check Margins: No 1.1 x 0.7 cm pink patch  Skin cancer screening  Return in about 1 year (around 11/08/2020).  I, Ashok Cordia, CMA, am acting as scribe for Sarina Ser, MD .  Documentation: I have reviewed the above documentation for accuracy and completeness, and I agree with the above.  Sarina Ser, MD

## 2019-11-10 ENCOUNTER — Encounter: Payer: Self-pay | Admitting: Dermatology

## 2019-11-11 ENCOUNTER — Telehealth: Payer: Self-pay

## 2019-11-11 NOTE — Telephone Encounter (Signed)
-----   Message from Ralene Bathe, MD sent at 11/10/2019  7:08 PM EST ----- Diagnosis Skin , right chest infraclavicular [place]  PreCancer Schedule for treatment (Ln2) May schedule after holidays if desired

## 2019-11-11 NOTE — Telephone Encounter (Signed)
Left message on voicemail to return my call.  

## 2019-11-16 ENCOUNTER — Telehealth: Payer: Self-pay

## 2019-11-16 NOTE — Telephone Encounter (Signed)
Patient advised of biopsy results and scheduled in January.

## 2019-11-16 NOTE — Telephone Encounter (Signed)
-----   Message from Ralene Bathe, MD sent at 11/10/2019  7:08 PM EST ----- Diagnosis Skin , right chest infraclavicular [place]  PreCancer Schedule for treatment (Ln2) May schedule after holidays if desired

## 2019-11-29 DIAGNOSIS — L570 Actinic keratosis: Secondary | ICD-10-CM

## 2019-11-29 HISTORY — DX: Actinic keratosis: L57.0

## 2020-01-21 ENCOUNTER — Ambulatory Visit (INDEPENDENT_AMBULATORY_CARE_PROVIDER_SITE_OTHER): Payer: Medicare Other | Admitting: Dermatology

## 2020-01-21 ENCOUNTER — Other Ambulatory Visit: Payer: Self-pay

## 2020-01-21 DIAGNOSIS — L578 Other skin changes due to chronic exposure to nonionizing radiation: Secondary | ICD-10-CM

## 2020-01-21 DIAGNOSIS — L820 Inflamed seborrheic keratosis: Secondary | ICD-10-CM | POA: Diagnosis not present

## 2020-01-21 DIAGNOSIS — L570 Actinic keratosis: Secondary | ICD-10-CM

## 2020-01-21 NOTE — Progress Notes (Signed)
   Follow-Up Visit   Subjective  Kaitlyn Bridges is a 86 y.o. female who presents for the following: Actinic Keratosis (Bx proven R chest infraclavicular, to txt with LN2 today) and check spot (L hand, >41m, ).  The following portions of the chart were reviewed this encounter and updated as appropriate:   Tobacco  Allergies  Meds  Problems  Med Hx  Surg Hx  Fam Hx     Review of Systems:  No other skin or systemic complaints except as noted in HPI or Assessment and Plan.  Objective  Well appearing patient in no apparent distress; mood and affect are within normal limits.  A focused examination was performed including face, chest, hands. Relevant physical exam findings are noted in the Assessment and Plan.  Objective  Right chest, infraclavicular x 1, L dorsum hand x 1 (2): Pink bx site R chest, infraclavicular Pink scaly macule L dorsum hand   Objective  Right Hand x 3 (3): Erythematous keratotic or waxy stuck-on papule or plaque.    Assessment & Plan    Actinic Damage - chronic, secondary to cumulative UV radiation exposure/sun exposure over time - diffuse scaly erythematous macules with underlying dyspigmentation - Recommend daily broad spectrum sunscreen SPF 30+ to sun-exposed areas, reapply every 2 hours as needed.  - Call for new or changing lesions.  AK (actinic keratosis) (2) Right chest, infraclavicular x 1, L dorsum hand x 1  R chest infraclavicular Bx proven 11/09/19  Destruction of lesion - Right chest, infraclavicular x 1, L dorsum hand x 1 Complexity: simple   Destruction method: cryotherapy   Informed consent: discussed and consent obtained   Timeout:  patient name, date of birth, surgical site, and procedure verified Lesion destroyed using liquid nitrogen: Yes   Region frozen until ice ball extended beyond lesion: Yes   Outcome: patient tolerated procedure well with no complications   Post-procedure details: wound care instructions given    Inflamed  seborrheic keratosis (3) Right Hand x 3  Destruction of lesion - Right Hand x 3 Complexity: simple   Destruction method: cryotherapy   Informed consent: discussed and consent obtained   Timeout:  patient name, date of birth, surgical site, and procedure verified Lesion destroyed using liquid nitrogen: Yes   Region frozen until ice ball extended beyond lesion: Yes   Outcome: patient tolerated procedure well with no complications   Post-procedure details: wound care instructions given    Return for as scheduled for TBSE.   I, Othelia Pulling, RMA, am acting as scribe for Sarina Ser, MD .  Documentation: I have reviewed the above documentation for accuracy and completeness, and I agree with the above.  Sarina Ser, MD

## 2020-01-24 ENCOUNTER — Encounter: Payer: Self-pay | Admitting: Dermatology

## 2020-11-10 ENCOUNTER — Ambulatory Visit (INDEPENDENT_AMBULATORY_CARE_PROVIDER_SITE_OTHER): Payer: Medicare Other | Admitting: Dermatology

## 2020-11-10 ENCOUNTER — Other Ambulatory Visit: Payer: Self-pay

## 2020-11-10 DIAGNOSIS — D692 Other nonthrombocytopenic purpura: Secondary | ICD-10-CM

## 2020-11-10 DIAGNOSIS — D1800 Hemangioma unspecified site: Secondary | ICD-10-CM

## 2020-11-10 DIAGNOSIS — L578 Other skin changes due to chronic exposure to nonionizing radiation: Secondary | ICD-10-CM

## 2020-11-10 DIAGNOSIS — L814 Other melanin hyperpigmentation: Secondary | ICD-10-CM

## 2020-11-10 DIAGNOSIS — Z1283 Encounter for screening for malignant neoplasm of skin: Secondary | ICD-10-CM | POA: Diagnosis not present

## 2020-11-10 DIAGNOSIS — Z872 Personal history of diseases of the skin and subcutaneous tissue: Secondary | ICD-10-CM

## 2020-11-10 DIAGNOSIS — L820 Inflamed seborrheic keratosis: Secondary | ICD-10-CM | POA: Diagnosis not present

## 2020-11-10 DIAGNOSIS — L821 Other seborrheic keratosis: Secondary | ICD-10-CM

## 2020-11-10 DIAGNOSIS — D229 Melanocytic nevi, unspecified: Secondary | ICD-10-CM

## 2020-11-10 NOTE — Progress Notes (Signed)
Follow-Up Visit   Subjective  Kaitlyn Bridges is a 86 y.o. female who presents for the following: Annual Exam (History of Aks - TBSE today). The patient presents for Total-Body Skin Exam (TBSE) for skin cancer screening and mole check.  The following portions of the chart were reviewed this encounter and updated as appropriate:   Tobacco  Allergies  Meds  Problems  Med Hx  Surg Hx  Fam Hx     Review of Systems:  No other skin or systemic complaints except as noted in HPI or Assessment and Plan.  Objective  Well appearing patient in no apparent distress; mood and affect are within normal limits.  A full examination was performed including scalp, head, eyes, ears, nose, lips, neck, chest, axillae, abdomen, back, buttocks, bilateral upper extremities, bilateral lower extremities, hands, feet, fingers, toes, fingernails, and toenails. All findings within normal limits unless otherwise noted below.  Left forearm x 1, right infraorbital x 2, back x 7, chest and shoulders x 9, right lat calf x 1 (20) Erythematous keratotic or waxy stuck-on papule or plaque.    Assessment & Plan   Purpura - Chronic; persistent and recurrent.  Treatable, but not curable. - Violaceous macules and patches - Benign - Related to trauma, age, sun damage and/or use of blood thinners, chronic use of topical and/or oral steroids - Observe - Can use OTC arnica containing moisturizer such as Dermend Bruise Formula if desired - Call for worsening or other concerns  Lentigines - Scattered tan macules - Due to sun exposure - Benign-appearing, observe - Recommend daily broad spectrum sunscreen SPF 30+ to sun-exposed areas, reapply every 2 hours as needed. - Call for any changes  Seborrheic Keratoses - Stuck-on, waxy, tan-brown papules and/or plaques  - Benign-appearing - Discussed benign etiology and prognosis. - Observe - Call for any changes  Melanocytic Nevi - Tan-brown and/or pink-flesh-colored  symmetric macules and papules - Benign appearing on exam today - Observation - Call clinic for new or changing moles - Recommend daily use of broad spectrum spf 30+ sunscreen to sun-exposed areas.   Hemangiomas - Red papules - Discussed benign nature - Observe - Call for any changes  Actinic Damage - Chronic condition, secondary to cumulative UV/sun exposure - diffuse scaly erythematous macules with underlying dyspigmentation - Recommend daily broad spectrum sunscreen SPF 30+ to sun-exposed areas, reapply every 2 hours as needed.  - Staying in the shade or wearing long sleeves, sun glasses (UVA+UVB protection) and wide brim hats (4-inch brim around the entire circumference of the hat) are also recommended for sun protection.  - Call for new or changing lesions.  Skin cancer screening performed today.  Inflamed seborrheic keratosis Left forearm x 1, right infraorbital x 2, back x 7, chest and shoulders x 9, right lat calf x 1  Destruction of lesion - Left forearm x 1, right infraorbital x 2, back x 7, chest and shoulders x 9, right lat calf x 1 Complexity: simple   Destruction method: cryotherapy   Informed consent: discussed and consent obtained   Timeout:  patient name, date of birth, surgical site, and procedure verified Lesion destroyed using liquid nitrogen: Yes   Region frozen until ice ball extended beyond lesion: Yes   Outcome: patient tolerated procedure well with no complications   Post-procedure details: wound care instructions given    Skin cancer screening  Return in about 1 year (around 11/10/2021) for TBSE.  I, Ashok Cordia, CMA, am acting as scribe for  Sarina Ser, MD . Documentation: I have reviewed the above documentation for accuracy and completeness, and I agree with the above.  Sarina Ser, MD

## 2020-11-10 NOTE — Patient Instructions (Signed)

## 2020-11-15 ENCOUNTER — Encounter: Payer: Self-pay | Admitting: Dermatology

## 2021-07-18 ENCOUNTER — Other Ambulatory Visit: Payer: Self-pay | Admitting: Internal Medicine

## 2021-07-18 DIAGNOSIS — J438 Other emphysema: Secondary | ICD-10-CM

## 2021-07-26 ENCOUNTER — Ambulatory Visit: Payer: Medicare Other | Attending: Internal Medicine

## 2021-07-26 DIAGNOSIS — J438 Other emphysema: Secondary | ICD-10-CM | POA: Diagnosis present

## 2021-07-26 LAB — PULMONARY FUNCTION TEST ARMC ONLY
DL/VA % pred: 63 %
DL/VA: 2.64 ml/min/mmHg/L
DLCO unc % pred: 58 %
DLCO unc: 9.76 ml/min/mmHg
FEF 25-75 Post: 0.51 L/sec
FEF 25-75 Pre: 0.39 L/sec
FEF2575-%Change-Post: 30 %
FEF2575-%Pred-Post: 49 %
FEF2575-%Pred-Pre: 37 %
FEV1-%Change-Post: 10 %
FEV1-%Pred-Post: 61 %
FEV1-%Pred-Pre: 55 %
FEV1-Post: 0.93 L
FEV1-Pre: 0.84 L
FEV1FVC-%Change-Post: -2 %
FEV1FVC-%Pred-Pre: 74 %
FEV6-%Change-Post: 12 %
FEV6-%Pred-Post: 89 %
FEV6-%Pred-Pre: 79 %
FEV6-Post: 1.73 L
FEV6-Pre: 1.54 L
FEV6FVC-%Change-Post: -1 %
FEV6FVC-%Pred-Post: 104 %
FEV6FVC-%Pred-Pre: 106 %
FVC-%Change-Post: 14 %
FVC-%Pred-Post: 85 %
FVC-%Pred-Pre: 74 %
FVC-Post: 1.77 L
FVC-Pre: 1.55 L
Post FEV1/FVC ratio: 53 %
Post FEV6/FVC ratio: 98 %
Pre FEV1/FVC ratio: 54 %
Pre FEV6/FVC Ratio: 99 %
RV % pred: 98 %
RV: 2.28 L
TLC % pred: 92 %
TLC: 4.28 L

## 2021-07-26 MED ORDER — ALBUTEROL SULFATE (2.5 MG/3ML) 0.083% IN NEBU
2.5000 mg | INHALATION_SOLUTION | Freq: Once | RESPIRATORY_TRACT | Status: AC
  Administered 2021-07-26: 2.5 mg via RESPIRATORY_TRACT

## 2021-07-27 ENCOUNTER — Encounter: Payer: Self-pay | Admitting: Ophthalmology

## 2021-08-03 NOTE — Discharge Instructions (Signed)

## 2021-08-08 ENCOUNTER — Ambulatory Visit
Admission: RE | Admit: 2021-08-08 | Discharge: 2021-08-08 | Disposition: A | Discharge status: Home / Self Care | Payer: Medicare Other | Attending: Ophthalmology | Admitting: Ophthalmology

## 2021-08-08 ENCOUNTER — Encounter: Payer: Self-pay | Admitting: Ophthalmology

## 2021-08-08 ENCOUNTER — Encounter
Admission: RE | Disposition: A | Discharge status: Home / Self Care | Payer: Self-pay | Source: Home / Self Care | Attending: Ophthalmology

## 2021-08-08 ENCOUNTER — Ambulatory Visit (AMBULATORY_SURGERY_CENTER): Payer: Medicare Other | Admitting: Anesthesiology

## 2021-08-08 ENCOUNTER — Other Ambulatory Visit: Payer: Self-pay

## 2021-08-08 ENCOUNTER — Ambulatory Visit: Payer: Medicare Other | Admitting: Anesthesiology

## 2021-08-08 DIAGNOSIS — E039 Hypothyroidism, unspecified: Secondary | ICD-10-CM | POA: Diagnosis not present

## 2021-08-08 DIAGNOSIS — I2510 Atherosclerotic heart disease of native coronary artery without angina pectoris: Secondary | ICD-10-CM | POA: Diagnosis not present

## 2021-08-08 DIAGNOSIS — Z87891 Personal history of nicotine dependence: Secondary | ICD-10-CM

## 2021-08-08 DIAGNOSIS — H2512 Age-related nuclear cataract, left eye: Secondary | ICD-10-CM | POA: Insufficient documentation

## 2021-08-08 HISTORY — PX: CATARACT EXTRACTION W/PHACO: SHX586

## 2021-08-08 SURGERY — PHACOEMULSIFICATION, CATARACT, WITH IOL INSERTION
Anesthesia: Monitor Anesthesia Care | Site: Eye | Laterality: Left

## 2021-08-08 MED ORDER — SIGHTPATH DOSE#1 BSS IO SOLN
INTRAOCULAR | Status: DC | PRN
  Administered 2021-08-08: 1 mL

## 2021-08-08 MED ORDER — TETRACAINE HCL 0.5 % OP SOLN
1.0000 [drp] | OPHTHALMIC | Status: DC | PRN
  Administered 2021-08-08 (×3): 1 [drp] via OPHTHALMIC

## 2021-08-08 MED ORDER — SIGHTPATH DOSE#1 BSS IO SOLN
INTRAOCULAR | Status: DC | PRN
  Administered 2021-08-08: 15 mL

## 2021-08-08 MED ORDER — ARMC OPHTHALMIC DILATING DROPS
1.0000 | OPHTHALMIC | Status: DC | PRN
  Administered 2021-08-08 (×3): 1 via OPHTHALMIC

## 2021-08-08 MED ORDER — SIGHTPATH DOSE#1 BSS IO SOLN
INTRAOCULAR | Status: DC | PRN
  Administered 2021-08-08: 77 mL via OPHTHALMIC

## 2021-08-08 MED ORDER — LACTATED RINGERS IV SOLN
INTRAVENOUS | Status: DC
Start: 2021-08-08 — End: 2021-08-08

## 2021-08-08 MED ORDER — LACTATED RINGERS IV SOLN: INTRAVENOUS | Status: DC

## 2021-08-08 MED ORDER — MOXIFLOXACIN HCL 0.5 % OP SOLN
OPHTHALMIC | Status: DC | PRN
  Administered 2021-08-08: 0.2 mL via OPHTHALMIC

## 2021-08-08 MED ORDER — MIDAZOLAM HCL 2 MG/2ML IJ SOLN
INTRAMUSCULAR | Status: DC | PRN
  Administered 2021-08-08: 1 mg via INTRAVENOUS

## 2021-08-08 MED ORDER — SIGHTPATH DOSE#1 NA CHONDROIT SULF-NA HYALURON 40-17 MG/ML IO SOLN
INTRAOCULAR | Status: DC | PRN
  Administered 2021-08-08: 1 mL via INTRAOCULAR

## 2021-08-08 MED ORDER — FENTANYL CITRATE (PF) 100 MCG/2ML IJ SOLN
INTRAMUSCULAR | Status: DC | PRN
Start: 2021-08-08 — End: 2021-08-08
  Administered 2021-08-08: 50 ug via INTRAVENOUS

## 2021-08-08 MED ORDER — BRIMONIDINE TARTRATE-TIMOLOL 0.2-0.5 % OP SOLN
OPHTHALMIC | Status: DC | PRN
  Administered 2021-08-08: 1 [drp] via OPHTHALMIC

## 2021-08-08 SURGICAL SUPPLY — 16 items
CANNULA ANT/CHMB 27G (MISCELLANEOUS) IMPLANT
CANNULA ANT/CHMB 27GA (MISCELLANEOUS) IMPLANT
CATARACT SUITE SIGHTPATH (MISCELLANEOUS) ×2 IMPLANT
FEE CATARACT SUITE SIGHTPATH (MISCELLANEOUS) ×1 IMPLANT
GLOVE SURG ENC TEXT LTX SZ8 (GLOVE) ×2 IMPLANT
GLOVE SURG TRIUMPH 8.0 PF LTX (GLOVE) ×2 IMPLANT
LENS IOL TECNIS EYHANCE 23.5 (Intraocular Lens) ×1 IMPLANT
NDL FILTER BLUNT 18X1 1/2 (NEEDLE) ×1 IMPLANT
NEEDLE FILTER BLUNT 18X 1/2SAF (NEEDLE) ×1
NEEDLE FILTER BLUNT 18X1 1/2 (NEEDLE) ×1 IMPLANT
PACK VIT ANT 23G (MISCELLANEOUS) IMPLANT
RING MALYGIN (MISCELLANEOUS) IMPLANT
SUT ETHILON 10-0 CS-B-6CS-B-6 (SUTURE)
SUTURE EHLN 10-0 CS-B-6CS-B-6 (SUTURE) IMPLANT
SYR 3ML LL SCALE MARK (SYRINGE) ×2 IMPLANT
WATER STERILE IRR 250ML POUR (IV SOLUTION) ×2 IMPLANT

## 2021-08-08 NOTE — Anesthesia Postprocedure Evaluation (Signed)
Anesthesia Post Note  Patient: Kaitlyn Bridges  Procedure(s) Performed: CATARACT EXTRACTION PHACO AND INTRAOCULAR LENS PLACEMENT (IOC) LEFT (Left: Eye)  Patient location during evaluation: PACU Anesthesia Type: MAC Level of consciousness: awake and alert Pain management: pain level controlled Vital Signs Assessment: post-procedure vital signs reviewed and stable Respiratory status: spontaneous breathing, nonlabored ventilation, respiratory function stable and patient connected to nasal cannula oxygen Cardiovascular status: stable and blood pressure returned to baseline Postop Assessment: no apparent nausea or vomiting Anesthetic complications: no   No notable events documented.   Last Vitals:  Vitals:   08/08/21 0846 08/08/21 0847  BP: 94/69 94/69  Pulse: 68 68  Resp: (!) 7 (!) 8  Temp:    SpO2: 94% 95%    Last Pain:  Vitals:   08/08/21 0742  TempSrc: Temporal  PainSc: 0-No pain                 Tessah Patchen Lorenza Chick

## 2021-08-08 NOTE — Op Note (Signed)
PREOPERATIVE DIAGNOSIS:  Nuclear sclerotic cataract of the left eye.   POSTOPERATIVE DIAGNOSIS:  Nuclear sclerotic cataract of the left eye.   OPERATIVE PROCEDURE:ORPROCALL@   SURGEON:  Birder Robson, MD.   ANESTHESIA:  Anesthesiologist: Molli Barrows, MD CRNA: Hedda Slade, CRNA  1.      Managed anesthesia care. 2.     0.35m of Shugarcaine was instilled following the paracentesis   COMPLICATIONS:  None.   TECHNIQUE:   Stop and chop   DESCRIPTION OF PROCEDURE:  The patient was examined and consented in the preoperative holding area where the aforementioned topical anesthesia was applied to the left eye and then brought back to the Operating Room where the left eye was prepped and draped in the usual sterile ophthalmic fashion and a lid speculum was placed. A paracentesis was created with the side port blade and the anterior chamber was filled with viscoelastic. A near clear corneal incision was performed with the steel keratome. A continuous curvilinear capsulorrhexis was performed with a cystotome followed by the capsulorrhexis forceps. Hydrodissection and hydrodelineation were carried out with BSS on a blunt cannula. The lens was removed in a stop and chop  technique and the remaining cortical material was removed with the irrigation-aspiration handpiece. The capsular bag was inflated with viscoelastic and the Technis [id number] lens was placed in the capsular bag without complication. The remaining viscoelastic was removed from the eye with the irrigation-aspiration handpiece. The wounds were hydrated. The anterior chamber was flushed with BSS and the eye was inflated to physiologic pressure. 0.170mVigamox was placed in the anterior chamber. The wounds were found to be water tight. The eye was dressed with Combigan. The patient was given protective glasses to wear throughout the day and a shield with which to sleep tonight. The patient was also given drops with which to begin a drop regimen  today and will follow-up with me in one day. Implant Name Type Inv. Item Serial No. Manufacturer Lot No. LRB No. Used Action  LENS IOL TECNIS EYHANCE 23.5 - [id number] Lens LENS IOL TECNIS EYHANCE 23.5 [id number]  Left 1 Implanted    Procedure(s) with comments: CATARACT EXTRACTION PHACO AND INTRAOCULAR LENS PLACEMENT (IOC) LEFT (Left) - 17.79 1:31.2  Electronically signed: WiBirder Robson/08/2021 8:45 AM

## 2021-08-08 NOTE — H&P (Signed)
[facility]   Primary Care Physician:  Adin Hector, MD Ophthalmologist: Dr. George Ina  Pre-Procedure History & Physical: HPI:  Kaitlyn Bridges is a 87 y.o. female here for cataract surgery.   Past Medical History:  Diagnosis Date   Actinic keratosis 11/29/2019   right chest infraclavicular   Colon polyps    Coronary artery disease    History of actinic keratosis 10/20/2014   right chest infraclavicular   Hyperlipidemia    Hypothyroidism    Liver cyst    Lung nodules    Osteoporosis    Recurrent UTI    Vitamin D deficiency     Past Surgical History:  Procedure Laterality Date   ABDOMINAL HYSTERECTOMY     BLADDER SURGERY     COLONOSCOPY     COLONOSCOPY WITH PROPOFOL N/A 11/16/2015   Procedure: COLONOSCOPY WITH PROPOFOL;  Surgeon: Manya Silvas, MD;  Location: [place];  Service: Endoscopy;  Laterality: N/A;   ESOPHAGOGASTRODUODENOSCOPY (EGD) WITH PROPOFOL N/A 11/16/2015   Procedure: ESOPHAGOGASTRODUODENOSCOPY (EGD) WITH PROPOFOL;  Surgeon: Manya Silvas, MD;  Location: [facility] ENDOSCOPY;  Service: Endoscopy;  Laterality: N/A;   OOPHORECTOMY      Prior to Admission medications   Medication Sig Start Date End Date Taking? Authorizing Provider  buPROPion (WELLBUTRIN XL) 150 MG 24 hr tablet Take 150 mg by mouth daily.   Yes [provider]  calcium carbonate (OSCAL) 1500 (600 Ca) MG TABS tablet Take by mouth 2 (two) times daily with a meal.   Yes [provider]  cholecalciferol (VITAMIN D3) 25 MCG (1000 UNIT) tablet Take 1,000 Units by mouth daily.   Yes [provider]  folic acid (FOLVITE) 1 MG tablet Take 1 mg by mouth daily.   Yes [provider]  levothyroxine (SYNTHROID, LEVOTHROID) 112 MCG tablet Take 112 mcg by mouth daily before breakfast.   Yes [provider]  lovastatin (ALTOPREV) 40 MG 24 hr tablet Take 40 mg by mouth at bedtime.   Yes [provider]  aspirin EC 81 MG tablet Take 81 mg by  mouth daily. Patient not taking: Reported on 07/27/2021    [provider]  traMADol (ULTRAM) 50 MG tablet Take 1 tablet (50 mg total) by mouth every 8 (eight) hours as needed for moderate pain. Patient not taking: Reported on 07/27/2021 11/03/18   Johnn Hai, PA-C    Allergies as of 06/20/2021 - Review Complete 11/15/2020  Allergen Reaction Noted   Penicillins Hives 01/07/2015   Sulfa antibiotics Rash 01/07/2015    History reviewed. No pertinent family history.  Social History   Socioeconomic History   Marital status: Divorced    Spouse name: Not on file   Number of children: Not on file   Years of education: Not on file   Highest education level: Not on file  Occupational History   Not on file  Tobacco Use   Smoking status: Former    Packs/day: 0.75    Types: Cigarettes   Smokeless tobacco: Never  Substance and Sexual Activity   Alcohol use: Yes    Alcohol/week: 7.0 standard drinks of alcohol    Types: 7 Glasses of wine per week    Comment: weekly   Drug use: No   Sexual activity: Not on file  Other Topics Concern   Not on file  Social History Narrative   Not on file   Social Determinants of Health   Financial Resource Strain: Not on file  Food  Insecurity: Not on file  Transportation Needs: Not on file  Physical Activity: Not on file  Stress: Not on file  Social Connections: Not on file  Intimate Partner Violence: Not on file    Review of Systems: See HPI, otherwise negative ROS  Physical Exam: BP (!) 152/93   Pulse 76   Temp 97.6 F (36.4 C) (Temporal)   Ht '5\' 1"'$  (1.549 m)   Wt 58.1 kg   SpO2 95%   BMI 24.19 kg/m  General:   Alert, cooperative in NAD Head:  Normocephalic and atraumatic. Respiratory:  Normal work of breathing. Cardiovascular:  RRR  Impression/Plan: Kaitlyn Bridges is here for cataract surgery.  Risks, benefits, limitations, and alternatives regarding cataract surgery have been reviewed with the patient.  Questions  have been answered.  All parties agreeable.   Birder Robson, MD  08/08/2021, 8:16 AM

## 2021-08-08 NOTE — Transfer of Care (Signed)
Immediate Anesthesia Transfer of Care Note  Patient: Kaitlyn Bridges  Procedure(s) Performed: CATARACT EXTRACTION PHACO AND INTRAOCULAR LENS PLACEMENT (IOC) LEFT (Left: Eye)  Patient Location: PACU  Anesthesia Type:MAC  Level of Consciousness: awake, alert  and oriented  Airway & Oxygen Therapy: Patient Spontanous Breathing  Post-op Assessment: Report given to RN and Post -op Vital signs reviewed and stable  Post vital signs: Reviewed and stable  Last Vitals:  Vitals Value Taken Time  BP 94/69 08/08/21 0846  Temp    Pulse 68 08/08/21 0846  Resp 8 08/08/21 0846  SpO2 95 % 08/08/21 0846  Vitals shown include unvalidated device data.  Last Pain:  Vitals:   08/08/21 0742  TempSrc: Temporal  PainSc: 0-No pain         Complications: No notable events documented.

## 2021-08-08 NOTE — Anesthesia Preprocedure Evaluation (Signed)
Anesthesia Evaluation  Patient identified by MRN, date of birth, ID band Patient awake    Reviewed: Allergy & Precautions, H&P , NPO status , Patient's Chart, lab work & pertinent test results, reviewed documented beta blocker date and time   Airway Mallampati: II  TM Distance: >3 FB Neck ROM: full    Dental no notable dental hx. (+) Poor Dentition   Pulmonary neg pulmonary ROS, former smoker,    Pulmonary exam normal breath sounds clear to auscultation       Cardiovascular Exercise Tolerance: Good + CAD  Normal cardiovascular exam Rhythm:regular Rate:Normal     Neuro/Psych negative neurological ROS  negative psych ROS   GI/Hepatic negative GI ROS, Neg liver ROS,   Endo/Other  Hypothyroidism   Renal/GU negative Renal ROS  negative genitourinary   Musculoskeletal   Abdominal   Peds  Hematology negative hematology ROS (+)   Anesthesia Other Findings Past Medical History: 11/29/2019: Actinic keratosis     Comment:  right chest infraclavicular No date: Colon polyps No date: Coronary artery disease 10/20/2014: History of actinic keratosis     Comment:  right chest infraclavicular No date: Hyperlipidemia No date: Hypothyroidism No date: Liver cyst No date: Lung nodules No date: Osteoporosis No date: Recurrent UTI No date: Vitamin D deficiency Past Surgical History: No date: ABDOMINAL HYSTERECTOMY No date: BLADDER SURGERY No date: COLONOSCOPY 11/16/2015: COLONOSCOPY WITH PROPOFOL; N/A     Comment:  Procedure: COLONOSCOPY WITH PROPOFOL;  Surgeon: Manya Silvas, MD;  Location: [facility] ENDOSCOPY;  Service:               Endoscopy;  Laterality: N/A; 11/16/2015: ESOPHAGOGASTRODUODENOSCOPY (EGD) WITH PROPOFOL; N/A     Comment:  Procedure: ESOPHAGOGASTRODUODENOSCOPY (EGD) WITH               PROPOFOL;  Surgeon: Manya Silvas, MD;  Location: [facility]              ENDOSCOPY;  Service: Endoscopy;   Laterality: N/A; No date: OOPHORECTOMY BMI    Body Mass Index: 24.19 kg/m     Reproductive/Obstetrics negative OB ROS                             Anesthesia Physical Anesthesia Plan  ASA: 3  Anesthesia Plan: MAC   Post-op Pain Management:    Induction:   PONV Risk Score and Plan:   Airway Management Planned:   Additional Equipment:   Intra-op Plan:   Post-operative Plan:   Informed Consent: I have reviewed the patients History and Physical, chart, labs and discussed the procedure including the risks, benefits and alternatives for the proposed anesthesia with the patient or authorized representative who has indicated his/her understanding and acceptance.     Dental Advisory Given  Plan Discussed with: CRNA  Anesthesia Plan Comments:         Anesthesia Quick Evaluation

## 2021-08-09 ENCOUNTER — Encounter: Payer: Self-pay | Admitting: Ophthalmology

## 2021-08-15 ENCOUNTER — Encounter: Payer: Self-pay | Admitting: Ophthalmology

## 2021-08-18 NOTE — Discharge Instructions (Signed)

## 2021-08-22 ENCOUNTER — Ambulatory Visit: Payer: Medicare Other | Admitting: Anesthesiology

## 2021-08-22 ENCOUNTER — Encounter
Admission: RE | Disposition: A | Discharge status: Home / Self Care | Payer: Self-pay | Source: Ambulatory Visit | Attending: Ophthalmology

## 2021-08-22 ENCOUNTER — Encounter: Payer: Self-pay | Admitting: Ophthalmology

## 2021-08-22 ENCOUNTER — Ambulatory Visit
Admission: RE | Admit: 2021-08-22 | Discharge: 2021-08-22 | Disposition: A | Discharge status: Home / Self Care | Payer: Medicare Other | Source: Ambulatory Visit | Attending: Ophthalmology | Admitting: Ophthalmology

## 2021-08-22 ENCOUNTER — Ambulatory Visit (AMBULATORY_SURGERY_CENTER): Payer: Medicare Other | Admitting: Anesthesiology

## 2021-08-22 ENCOUNTER — Other Ambulatory Visit: Payer: Self-pay

## 2021-08-22 DIAGNOSIS — Z87891 Personal history of nicotine dependence: Secondary | ICD-10-CM | POA: Diagnosis not present

## 2021-08-22 DIAGNOSIS — I2510 Atherosclerotic heart disease of native coronary artery without angina pectoris: Secondary | ICD-10-CM | POA: Diagnosis not present

## 2021-08-22 DIAGNOSIS — H2511 Age-related nuclear cataract, right eye: Secondary | ICD-10-CM | POA: Diagnosis present

## 2021-08-22 DIAGNOSIS — E039 Hypothyroidism, unspecified: Secondary | ICD-10-CM | POA: Insufficient documentation

## 2021-08-22 HISTORY — DX: Rheumatoid arthritis, unspecified: M06.9

## 2021-08-22 HISTORY — PX: CATARACT EXTRACTION W/PHACO: SHX586

## 2021-08-22 SURGERY — PHACOEMULSIFICATION, CATARACT, WITH IOL INSERTION
Anesthesia: Monitor Anesthesia Care | Site: Eye | Laterality: Right

## 2021-08-22 MED ORDER — MIDAZOLAM HCL 2 MG/2ML IJ SOLN
INTRAMUSCULAR | Status: DC | PRN
  Administered 2021-08-22: 2 mg via INTRAVENOUS

## 2021-08-22 MED ORDER — SIGHTPATH DOSE#1 BSS IO SOLN
INTRAOCULAR | Status: DC | PRN
  Administered 2021-08-22: 78 mL via OPHTHALMIC

## 2021-08-22 MED ORDER — TETRACAINE HCL 0.5 % OP SOLN
1.0000 [drp] | OPHTHALMIC | Status: DC | PRN
  Administered 2021-08-22 (×3): 1 [drp] via OPHTHALMIC

## 2021-08-22 MED ORDER — SIGHTPATH DOSE#1 NA CHONDROIT SULF-NA HYALURON 40-17 MG/ML IO SOLN
INTRAOCULAR | Status: DC | PRN
  Administered 2021-08-22: 1 mL via INTRAOCULAR

## 2021-08-22 MED ORDER — MOXIFLOXACIN HCL 0.5 % OP SOLN
OPHTHALMIC | Status: DC | PRN
  Administered 2021-08-22: 0.2 mL via OPHTHALMIC

## 2021-08-22 MED ORDER — SIGHTPATH DOSE#1 BSS IO SOLN
INTRAOCULAR | Status: DC | PRN
  Administered 2021-08-22: 15 mL

## 2021-08-22 MED ORDER — LACTATED RINGERS IV SOLN: INTRAVENOUS | Status: DC

## 2021-08-22 MED ORDER — FENTANYL CITRATE (PF) 100 MCG/2ML IJ SOLN
INTRAMUSCULAR | Status: DC | PRN
Start: 2021-08-22 — End: 2021-08-22
  Administered 2021-08-22: 50 ug via INTRAVENOUS

## 2021-08-22 MED ORDER — BRIMONIDINE TARTRATE-TIMOLOL 0.2-0.5 % OP SOLN
OPHTHALMIC | Status: DC | PRN
  Administered 2021-08-22: 1 [drp] via OPHTHALMIC

## 2021-08-22 MED ORDER — ARMC OPHTHALMIC DILATING DROPS
1.0000 | OPHTHALMIC | Status: DC | PRN
  Administered 2021-08-22 (×3): 1 via OPHTHALMIC

## 2021-08-22 MED ORDER — SIGHTPATH DOSE#1 BSS IO SOLN
INTRAOCULAR | Status: DC | PRN
  Administered 2021-08-22: 1 mL

## 2021-08-22 SURGICAL SUPPLY — 16 items
CANNULA ANT/CHMB 27G (MISCELLANEOUS) IMPLANT
CANNULA ANT/CHMB 27GA (MISCELLANEOUS) IMPLANT
CATARACT SUITE SIGHTPATH (MISCELLANEOUS) ×1 IMPLANT
FEE CATARACT SUITE SIGHTPATH (MISCELLANEOUS) ×1 IMPLANT
GLOVE SURG ENC TEXT LTX SZ8 (GLOVE) ×1 IMPLANT
GLOVE SURG TRIUMPH 8.0 PF LTX (GLOVE) ×1 IMPLANT
LENS IOL TECNIS EYHANCE 22.5 (Intraocular Lens) IMPLANT
NDL FILTER BLUNT 18X1 1/2 (NEEDLE) ×1 IMPLANT
NEEDLE FILTER BLUNT 18X 1/2SAF (NEEDLE) ×1
NEEDLE FILTER BLUNT 18X1 1/2 (NEEDLE) ×1 IMPLANT
PACK VIT ANT 23G (MISCELLANEOUS) IMPLANT
RING MALYGIN (MISCELLANEOUS) IMPLANT
SUT ETHILON 10-0 CS-B-6CS-B-6 (SUTURE)
SUTURE EHLN 10-0 CS-B-6CS-B-6 (SUTURE) IMPLANT
SYR 3ML LL SCALE MARK (SYRINGE) ×1 IMPLANT
WATER STERILE IRR 250ML POUR (IV SOLUTION) ×1 IMPLANT

## 2021-08-22 NOTE — Addendum Note (Signed)
Addendum  created 08/22/21 0926 by Dimas Millin, MD   Clinical Note Signed

## 2021-08-22 NOTE — H&P (Signed)
[facility]   Primary Care Physician:  Adin Hector, MD Ophthalmologist: Dr. George Ina  Pre-Procedure History & Physical: HPI:  Kaitlyn Bridges is a 87 y.o. female here for cataract surgery.   Past Medical History:  Diagnosis Date   Actinic keratosis 11/29/2019   right chest infraclavicular   Colon polyps    Coronary artery disease    History of actinic keratosis 10/20/2014   right chest infraclavicular   Hyperlipidemia    Hypothyroidism    Liver cyst    Lung nodules    Osteoporosis    Recurrent UTI    Rheumatoid arthritis ([place])    Vitamin D deficiency     Past Surgical History:  Procedure Laterality Date   ABDOMINAL HYSTERECTOMY     BLADDER SURGERY     CATARACT EXTRACTION W/PHACO Left 08/08/2021   Procedure: CATARACT EXTRACTION PHACO AND INTRAOCULAR LENS PLACEMENT ([place]) LEFT;  Surgeon: Birder Robson, MD;  Location: [place];  Service: Ophthalmology;  Laterality: Left;  17.79 1:31.2   COLONOSCOPY     COLONOSCOPY WITH PROPOFOL N/A 11/16/2015   Procedure: COLONOSCOPY WITH PROPOFOL;  Surgeon: Manya Silvas, MD;  Location: [facility] ENDOSCOPY;  Service: Endoscopy;  Laterality: N/A;   ESOPHAGOGASTRODUODENOSCOPY (EGD) WITH PROPOFOL N/A 11/16/2015   Procedure: ESOPHAGOGASTRODUODENOSCOPY (EGD) WITH PROPOFOL;  Surgeon: Manya Silvas, MD;  Location: [facility] ENDOSCOPY;  Service: Endoscopy;  Laterality: N/A;   OOPHORECTOMY      Prior to Admission medications   Medication Sig Start Date End Date Taking? Authorizing Provider  acetaminophen (TYLENOL) 500 MG tablet Take 500 mg by mouth every 6 (six) hours as needed.   Yes [provider]  alendronate (FOSAMAX) 70 MG tablet Take 70 mg by mouth once a week. Take with a full glass of water on an empty stomach. (Saturday)   Yes [provider]  aspirin EC 81 MG tablet Take 81 mg by mouth daily.   Yes [provider]  calcium carbonate (OSCAL) 1500 (600 Ca) MG TABS tablet Take by mouth 2 (two)  times daily with a meal.   Yes [provider]  cholecalciferol (VITAMIN D3) 25 MCG (1000 UNIT) tablet Take 2,000 Units by mouth daily.   Yes [provider]  folic acid (FOLVITE) 1 MG tablet Take 2 mg by mouth daily.   Yes [provider]  inFLIXimab-abda (RENFLEXIS) 100 MG SOLR Inject 300 mg into the vein every 6 (six) weeks.   Yes [provider]  levothyroxine (SYNTHROID, LEVOTHROID) 112 MCG tablet Take 88 mcg by mouth daily before breakfast.   Yes [provider]  methotrexate (RHEUMATREX) 2.5 MG tablet Take 5 mg by mouth once a week. Caution:Chemotherapy. Protect from light.  (Monday)   Yes [provider]    Allergies as of 06/20/2021 - Review Complete 11/15/2020  Allergen Reaction Noted   Penicillins Hives 01/07/2015   Sulfa antibiotics Rash 01/07/2015    History reviewed. No pertinent family history.  Social History   Socioeconomic History   Marital status: Divorced    Spouse name: Not on file   Number of children: Not on file   Years of education: Not on file   Highest education level: Not on file  Occupational History   Not on file  Tobacco Use   Smoking status: Former    Packs/day: 0.75    Types: Cigarettes   Smokeless tobacco: Never  Substance and Sexual Activity   Alcohol use: Yes    Alcohol/week: 7.0 standard drinks  of alcohol    Types: 7 Glasses of wine per week    Comment: weekly   Drug use: No   Sexual activity: Not on file  Other Topics Concern   Not on file  Social History Narrative   Not on file   Social Determinants of Health   Financial Resource Strain: Not on file  Food Insecurity: Not on file  Transportation Needs: Not on file  Physical Activity: Not on file  Stress: Not on file  Social Connections: Not on file  Intimate Partner Violence: Not on file    Review of Systems: See HPI, otherwise negative ROS  Physical Exam: Ht '5\' 1"'$  (1.549 m)   Wt 58.1 kg   BMI 24.19 kg/m  General:    Alert, cooperative in NAD Head:  Normocephalic and atraumatic. Respiratory:  Normal work of breathing. Cardiovascular:  RRR  Impression/Plan: Kaitlyn Bridges is here for cataract surgery.  Risks, benefits, limitations, and alternatives regarding cataract surgery have been reviewed with the patient.  Questions have been answered.  All parties agreeable.   Birder Robson, MD  08/22/2021, 7:25 AM

## 2021-08-22 NOTE — Anesthesia Preprocedure Evaluation (Signed)
Anesthesia Evaluation  Patient identified by MRN, date of birth, ID band Patient awake    Reviewed: Allergy & Precautions, H&P , NPO status , Patient's Chart, lab work & pertinent test results, reviewed documented beta blocker date and time   History of Anesthesia Complications Negative for: history of anesthetic complications  Airway Mallampati: II  TM Distance: >3 FB Neck ROM: full    Dental no notable dental hx. (+) Poor Dentition   Pulmonary neg pulmonary ROS, former smoker,    Pulmonary exam normal breath sounds clear to auscultation       Cardiovascular Exercise Tolerance: Good + CAD  Normal cardiovascular exam Rhythm:regular Rate:Normal     Neuro/Psych negative neurological ROS  negative psych ROS   GI/Hepatic negative GI ROS, Neg liver ROS,   Endo/Other  Hypothyroidism   Renal/GU negative Renal ROS  negative genitourinary   Musculoskeletal   Abdominal   Peds  Hematology negative hematology ROS (+)   Anesthesia Other Findings Past Medical History: 11/29/2019: Actinic keratosis     Comment:  right chest infraclavicular No date: Colon polyps No date: Coronary artery disease 10/20/2014: History of actinic keratosis     Comment:  right chest infraclavicular No date: Hyperlipidemia No date: Hypothyroidism No date: Liver cyst No date: Lung nodules No date: Osteoporosis No date: Recurrent UTI No date: Vitamin D deficiency Past Surgical History: No date: ABDOMINAL HYSTERECTOMY No date: BLADDER SURGERY No date: COLONOSCOPY 11/16/2015: COLONOSCOPY WITH PROPOFOL; N/A     Comment:  Procedure: COLONOSCOPY WITH PROPOFOL;  Surgeon: Manya Silvas, MD;  Location: [facility] ENDOSCOPY;  Service:               Endoscopy;  Laterality: N/A; 11/16/2015: ESOPHAGOGASTRODUODENOSCOPY (EGD) WITH PROPOFOL; N/A     Comment:  Procedure: ESOPHAGOGASTRODUODENOSCOPY (EGD) WITH               PROPOFOL;  Surgeon:  Manya Silvas, MD;  Location: [facility]              ENDOSCOPY;  Service: Endoscopy;  Laterality: N/A; No date: OOPHORECTOMY BMI    Body Mass Index: 24.19 kg/m     Reproductive/Obstetrics negative OB ROS                             Anesthesia Physical  Anesthesia Plan  ASA: 3  Anesthesia Plan: MAC   Post-op Pain Management:    Induction: Intravenous  PONV Risk Score and Plan: 2  Airway Management Planned: Natural Airway and Nasal Cannula  Additional Equipment:   Intra-op Plan:   Post-operative Plan:   Informed Consent: I have reviewed the patients History and Physical, chart, labs and discussed the procedure including the risks, benefits and alternatives for the proposed anesthesia with the patient or authorized representative who has indicated his/her understanding and acceptance.     Dental Advisory Given  Plan Discussed with: CRNA  Anesthesia Plan Comments: (Patient consented for risks of anesthesia including but not limited to:  - adverse reactions to medications - damage to eyes, teeth, lips or other oral mucosa - nerve damage due to positioning  - sore throat or hoarseness - Damage to heart, brain, nerves, lungs, other parts of body or loss of life  Patient voiced understanding.)        Anesthesia Quick Evaluation

## 2021-08-22 NOTE — Transfer of Care (Signed)
Immediate Anesthesia Transfer of Care Note  Patient: Kaitlyn Bridges  Procedure(s) Performed: CATARACT EXTRACTION PHACO AND INTRAOCULAR LENS PLACEMENT (IOC) RIGHT (Right: Eye)  Patient Location: PACU  Anesthesia Type: MAC  Level of Consciousness: awake, alert  and patient cooperative  Airway and Oxygen Therapy: Patient Spontanous Breathing   Post-op Assessment: Post-op Vital signs reviewed, Patient's Cardiovascular Status Stable, Respiratory Function Stable, Patent Airway and No signs of Nausea or vomiting  Post-op Vital Signs: Reviewed and stable  Complications: No notable events documented.

## 2021-08-22 NOTE — Op Note (Signed)
PREOPERATIVE DIAGNOSIS:  Nuclear sclerotic cataract of the right eye.   POSTOPERATIVE DIAGNOSIS:  Cataract   OPERATIVE PROCEDURE:ORPROCALL@   SURGEON:  Birder Robson, MD.   ANESTHESIA:  Anesthesiologist: Dimas Millin, MD CRNA: Janna Arch, CRNA  1.      Managed anesthesia care. 2.      0.59m of Shugarcaine was instilled in the eye following the paracentesis.   COMPLICATIONS:  None.   TECHNIQUE:   Stop and chop   DESCRIPTION OF PROCEDURE:  The patient was examined and consented in the preoperative holding area where the aforementioned topical anesthesia was applied to the right eye and then brought back to the Operating Room where the right eye was prepped and draped in the usual sterile ophthalmic fashion and a lid speculum was placed. A paracentesis was created with the side port blade and the anterior chamber was filled with viscoelastic. A near clear corneal incision was performed with the steel keratome. A continuous curvilinear capsulorrhexis was performed with a cystotome followed by the capsulorrhexis forceps. Hydrodissection and hydrodelineation were carried out with BSS on a blunt cannula. The lens was removed in a stop and chop  technique and the remaining cortical material was removed with the irrigation-aspiration handpiece. The capsular bag was inflated with viscoelastic and the Technis ZCB00  lens was placed in the capsular bag without complication. The remaining viscoelastic was removed from the eye with the irrigation-aspiration handpiece. The wounds were hydrated. The anterior chamber was flushed with BSS and the eye was inflated to physiologic pressure. 0.163mof Vigamox was placed in the anterior chamber. The wounds were found to be water tight. The eye was dressed with Combigan. The patient was given protective glasses to wear throughout the day and a shield with which to sleep tonight. The patient was also given drops with which to begin a drop regimen today and  will follow-up with me in one day. Implant Name Type Inv. Item Serial No. Manufacturer Lot No. LRB No. Used Action  LENS IOL TECNIS EYHANCE 22.5 - [id number] Lens LENS IOL TECNIS EYHANCE 22.5 [id number]  Right 1 Implanted   Procedure(s) with comments: CATARACT EXTRACTION PHACO AND INTRAOCULAR LENS PLACEMENT (IOC) RIGHT (Right) - 24.43 1:51.5  Electronically signed: WiBirder Robson/22/2023 8:28 AM

## 2021-08-22 NOTE — Anesthesia Postprocedure Evaluation (Addendum)
Anesthesia Post Note  Patient: Kaitlyn Bridges  Procedure(s) Performed: CATARACT EXTRACTION PHACO AND INTRAOCULAR LENS PLACEMENT (IOC) RIGHT (Right: Eye)     Patient location during evaluation: PACU Anesthesia Type: MAC Level of consciousness: awake and alert Pain management: pain level controlled Vital Signs Assessment: post-procedure vital signs reviewed and stable Respiratory status: spontaneous breathing, nonlabored ventilation, respiratory function stable and patient connected to nasal cannula oxygen Cardiovascular status: stable and blood pressure returned to baseline Postop Assessment: no apparent nausea or vomiting Anesthetic complications: no   No notable events documented.  Dimas Millin

## 2021-08-23 ENCOUNTER — Encounter: Payer: Self-pay | Admitting: Ophthalmology

## 2021-08-29 ENCOUNTER — Ambulatory Visit (INDEPENDENT_AMBULATORY_CARE_PROVIDER_SITE_OTHER): Payer: Medicare Other | Admitting: Dermatology

## 2021-08-29 ENCOUNTER — Encounter: Payer: Self-pay | Admitting: Dermatology

## 2021-08-29 DIAGNOSIS — C4492 Squamous cell carcinoma of skin, unspecified: Secondary | ICD-10-CM

## 2021-08-29 DIAGNOSIS — L508 Other urticaria: Secondary | ICD-10-CM | POA: Diagnosis not present

## 2021-08-29 DIAGNOSIS — D492 Neoplasm of unspecified behavior of bone, soft tissue, and skin: Secondary | ICD-10-CM

## 2021-08-29 DIAGNOSIS — C44321 Squamous cell carcinoma of skin of nose: Secondary | ICD-10-CM | POA: Diagnosis not present

## 2021-08-29 DIAGNOSIS — W57XXXA Bitten or stung by nonvenomous insect and other nonvenomous arthropods, initial encounter: Secondary | ICD-10-CM

## 2021-08-29 DIAGNOSIS — L282 Other prurigo: Secondary | ICD-10-CM

## 2021-08-29 HISTORY — DX: Squamous cell carcinoma of skin, unspecified: C44.92

## 2021-08-29 MED ORDER — CLOBETASOL PROPIONATE 0.05 % EX CREA: TOPICAL_CREAM | CUTANEOUS | 1 refills | Status: AC

## 2021-08-29 NOTE — Progress Notes (Addendum)
   Follow-Up Visit   Subjective  Kaitlyn Bridges is a 87 y.o. female who presents for the following: lesion (Left side of nose. Dur: 1-2 weeks. Non tender, has not bled. Rough texture). The patient has spots, moles and lesions to be evaluated, some may be new or changing and the patient has concerns that these could be cancer.  The following portions of the chart were reviewed this encounter and updated as appropriate:  Tobacco  Allergies  Meds  Problems  Med Hx  Surg Hx  Fam Hx      Review of Systems: No other skin or systemic complaints except as noted in HPI or Assessment and Plan.   Objective  Well appearing patient in no apparent distress; mood and affect are within normal limits.  A focused examination was performed including face. Relevant physical exam findings are noted in the Assessment and Plan.  left nasofacial angle 0.6 cm firm pink papule with central erosion     Right Upper Arm - Anterior Edematous, erythematous large plaque   Assessment & Plan  Neoplasm of skin left nasofacial angle  Skin / nail biopsy Type of biopsy: tangential   Informed consent: discussed and consent obtained   Anesthesia: the lesion was anesthetized in a standard fashion   Anesthesia comment:  Area prepped with alcohol Anesthetic:  1% lidocaine w/ epinephrine 1-100,000 buffered w/ 8.4% NaHCO3 Instrument used: flexible razor blade   Hemostasis achieved with: pressure, aluminum chloride and electrodesiccation   Outcome: patient tolerated procedure well   Post-procedure details: wound care instructions given   Post-procedure details comment:  Ointment and small bandage applied  Specimen 1 - Surgical pathology Differential Diagnosis: R/O SCC  Check Margins: No  Discussed Mohs with patient. Plan to refer [facility] for Mohs pending pathology results.   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 Non tender  History of recurrent significantly inflamed/itchy  bites  Chronic and persistent condition with duration or expected duration over one year. Condition is bothersome/symptomatic for patient. Currently flared.  Start Clobetasol cream apply twice daily to affected areas up to 2 weeks as needed for insect bites. Avoid applying to face, groin, and axilla. Use as directed. Long-term use can cause thinning of the skin.  Topical steroids (such as triamcinolone, fluocinolone, fluocinonide, mometasone, clobetasol, halobetasol, betamethasone, hydrocortisone) can cause thinning and lightening of the skin if they are used for too long in the same area. Your physician has selected the right strength medicine for your problem and area affected on the body. Please use your medication only as directed by your physician to prevent side effects.    clobetasol cream (TEMOVATE) 0.05 % - Right Upper Arm - Anterior Apply twice daily up to 2 weeks as needed for insect bites. Avoid applying to face, groin, and axilla.   Return for TBSE As Scheduled with Dr. Gwen Pounds .  I, Lawson Radar, CMA, am acting as scribe for Darden Dates, MD.  Documentation: I have reviewed the above documentation for accuracy and completeness, and I agree with the above.  Darden Dates, MD

## 2021-08-29 NOTE — Patient Instructions (Addendum)
Wound Care Instructions  Cleanse wound gently with soap and water once a day then pat dry with clean gauze. Apply a thin coat of Petrolatum (petroleum jelly, "Vaseline") over the wound (unless you have an allergy to this). We recommend that you use a new, sterile tube of Vaseline. Do not pick or remove scabs. Do not remove the yellow or white "healing tissue" from the base of the wound.  Cover the wound with fresh, clean, nonstick gauze and secure with paper tape. You may use Band-Aids in place of gauze and tape if the wound is small enough, but would recommend trimming much of the tape off as there is often too much. Sometimes Band-Aids can irritate the skin.  You should call the office for your biopsy report after 1 week if you have not already been contacted.  If you experience any problems, such as abnormal amounts of bleeding, swelling, significant bruising, significant pain, or evidence of infection, please call the office immediately.  FOR ADULT SURGERY PATIENTS: If you need something for pain relief you may take 1 extra strength Tylenol (acetaminophen) AND 2 Ibuprofen ('200mg'$  each) together every 4 hours as needed for pain. (do not take these if you are allergic to them or if you have a reason you should not take them.) Typically, you may only need pain medication for 1 to 3 days.   Start Clobetasol cream apply twice daily to affected areas up to 2 weeks as needed for insect bites. Avoid applying to face, groin, and axilla. Use as directed. Long-term use can cause thinning of the skin.  Topical steroids (such as triamcinolone, fluocinolone, fluocinonide, mometasone, clobetasol, halobetasol, betamethasone, hydrocortisone) can cause thinning and lightening of the skin if they are used for too long in the same area. Your physician has selected the right strength medicine for your problem and area affected on the body. Please use your medication only as directed by your physician to prevent side  effects.      Due to recent changes in healthcare laws, you may see results of your pathology and/or laboratory studies on MyChart before the doctors have had a chance to review them. We understand that in some cases there may be results that are confusing or concerning to you. Please understand that not all results are received at the same time and often the doctors may need to interpret multiple results in order to provide you with the best plan of care or course of treatment. Therefore, we ask that you please give [place] 2 business days to thoroughly review all your results before contacting the office for clarification. Should we see a critical lab result, you will be contacted sooner.   If You Need Anything After Your Visit  If you have any questions or concerns for your doctor, please call our main line at [phone] and press option 4 to reach your doctor's medical assistant. If no one answers, please leave a voicemail as directed and we will return your call as soon as possible. Messages left after 4 pm will be answered the following business day.   You may also send [place] a message via [place]. We typically respond to MyChart messages within 1-2 business days.  For prescription refills, please ask your pharmacy to contact our office. Our fax number is [phone].  If you have an urgent issue when the clinic is closed that cannot wait until the next business day, you can page your doctor at the number below.    Please note  that while we do our best to be available for urgent issues outside of office hours, we are not available 24/7.   If you have an urgent issue and are unable to reach [place], you may choose to seek medical care at your doctor's office, retail clinic, urgent care center, or emergency room.  If you have a medical emergency, please immediately call 911 or go to the emergency department.  Pager Numbers  - Dr. Nehemiah Massed: [phone]  - Dr. Laurence Ferrari: [phone]  - Dr.  Nicole Kindred: [phone]  In the event of inclement weather, please call our main line at [phone] for an update on the status of any delays or closures.  Dermatology Medication Tips: Please keep the boxes that topical medications come in in order to help keep track of the instructions about where and how to use these. Pharmacies typically print the medication instructions only on the boxes and not directly on the medication tubes.   If your medication is too expensive, please contact our office at [phone] option 4 or send [place] a message through [place].   We are unable to tell what your co-pay for medications will be in advance as this is different depending on your insurance coverage. However, we may be able to find a substitute medication at lower cost or fill out paperwork to get insurance to cover a needed medication.   If a prior authorization is required to get your medication covered by your insurance company, please allow [place] 1-2 business days to complete this process.  Drug prices often vary depending on where the prescription is filled and some pharmacies may offer cheaper prices.  The website www.goodrx.com contains coupons for medications through different pharmacies. The prices here do not account for what the cost may be with help from insurance (it may be cheaper with your insurance), but the website can give you the price if you did not use any insurance.  - You can print the associated coupon and take it with your prescription to the pharmacy.  - You may also stop by our office during regular business hours and pick up a GoodRx coupon card.  - If you need your prescription sent electronically to a different pharmacy, notify our office through [facility] or by phone at [phone] option 4.     Si Usted Necesita Algo Despus de Su Visita  Tambin puede enviarnos un mensaje a travs de Pharmacist, community. Por lo general respondemos a los mensajes de MyChart en el transcurso  de 1 a 2 das hbiles.  Para renovar recetas, por favor pida a su farmacia que se ponga en contacto con nuestra oficina. Harland Dingwall de fax es [place] [phone].  Si tiene un asunto urgente cuando la clnica est cerrada y que no puede esperar hasta el siguiente da hbil, puede llamar/localizar a su doctor(a) al nmero que aparece a continuacin.   Por favor, tenga en cuenta que aunque hacemos todo lo posible para estar disponibles para asuntos urgentes fuera del horario de [place], no estamos disponibles las 24 horas del da, los 7 das de la [place].   Si tiene un problema urgente y no puede comunicarse con nosotros, puede optar por buscar atencin mdica  en el consultorio de su doctor(a), en una clnica privada, en un centro de atencin urgente o en una sala de emergencias.  Si tiene Engineering geologist, por favor llame inmediatamente al 911 o vaya a la sala de emergencias.  Nmeros de bper  - Dr. Nehemiah Massed: [phone]  -  DraLaurence Ferrari: [phone]  - Dra. Nicole Kindred: [phone]  En caso de inclemencias del [place], por favor llame a Johnsie Kindred principal al [phone] para una actualizacin sobre el [place] de cualquier retraso o cierre.  Consejos para la medicacin en dermatologa: Por favor, guarde las cajas en las que vienen los medicamentos de uso tpico para ayudarle a seguir las instrucciones sobre dnde y cmo usarlos. Las farmacias generalmente imprimen las instrucciones del medicamento slo en las cajas y no directamente en los tubos del [place].   Si su medicamento es muy caro, por favor, pngase en contacto con Zigmund Daniel llamando al [phone] y presione la opcin 4 o envenos un mensaje a travs de Pharmacist, community.   No podemos decirle cul ser su copago por los medicamentos por adelantado ya que esto es diferente dependiendo de la cobertura de su seguro. Sin embargo, es posible que podamos encontrar un medicamento sustituto a Electrical engineer un formulario  para que el seguro cubra el medicamento que se considera necesario.   Si se requiere una autorizacin previa para que su compaa de seguros [place] su medicamento, por favor permtanos de 1 a 2 das hbiles para completar este proceso.  Los precios de los medicamentos varan con frecuencia dependiendo del Environmental consultant de dnde se surte la receta y alguna farmacias pueden ofrecer precios ms baratos.  El sitio web www.goodrx.com tiene cupones para medicamentos de Airline pilot. Los precios aqu no tienen en cuenta lo que podra costar con la ayuda del seguro (puede ser ms barato con su seguro), pero el sitio web puede darle el precio si no utiliz Research scientist (physical sciences).  - Puede imprimir el cupn correspondiente y llevarlo con su receta a la farmacia.  - Tambin puede pasar por nuestra oficina durante el horario de atencin regular y Charity fundraiser una tarjeta de cupones de GoodRx.  - Si necesita que su receta se enve electrnicamente a una farmacia diferente, informe a nuestra oficina a travs de MyChart de  o por telfono llamando al [phone] y presione la opcin 4.

## 2021-08-30 ENCOUNTER — Encounter: Payer: Self-pay | Admitting: Dermatology

## 2021-09-05 ENCOUNTER — Telehealth: Payer: Self-pay

## 2021-09-05 DIAGNOSIS — C44320 Squamous cell carcinoma of skin of unspecified parts of face: Secondary | ICD-10-CM

## 2021-09-05 NOTE — Telephone Encounter (Signed)
Patient called and notified of bx results. Patient prefers to go see DR. Cook at [facility] for her Mohs surgery, referral placed.

## 2021-09-05 NOTE — Telephone Encounter (Signed)
-----   Message from Alfonso Patten, MD sent at 08/31/2021  1:23 PM EDT ----- Skin , left nasofacial angle SQUAMOUS CELL CARCINOMA, KERATOACANTHOMA TYPE --> recommend Mohs surgery. Would consider Mohs with Dr. Winifred Olive at the [facility] in [place] as they can often get people in faster and this type of skin cancer is often quick growing. However, if she prefers Dr. Lacinda Axon at [facility], we could send her there as long as she is not noticing this spot growing significantly while waiting for her appointment.   MAs please call with results and refer. Thank you!

## 2021-09-25 IMAGING — MG DIGITAL SCREENING BILAT W/ TOMO W/ CAD
8 series · 8 of 24 positions shown · non-contrast
Comparison: Previous exam(s).

CLINICAL DATA: Screening.

EXAM:
DIGITAL SCREENING BILATERAL MAMMOGRAM WITH TOMO AND CAD

[L CC synth-2D]
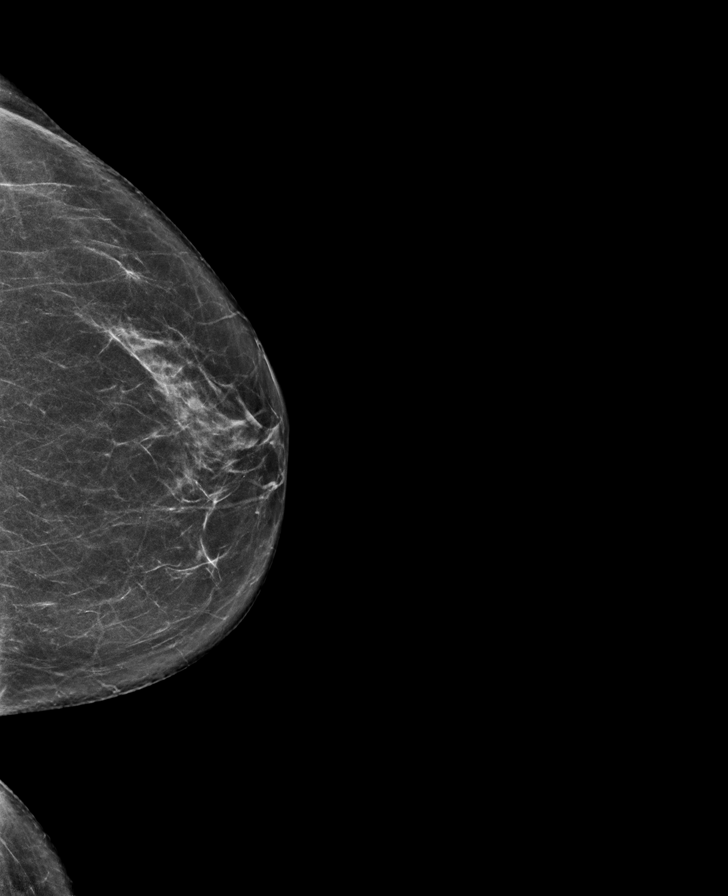

[L MLO synth-2D]
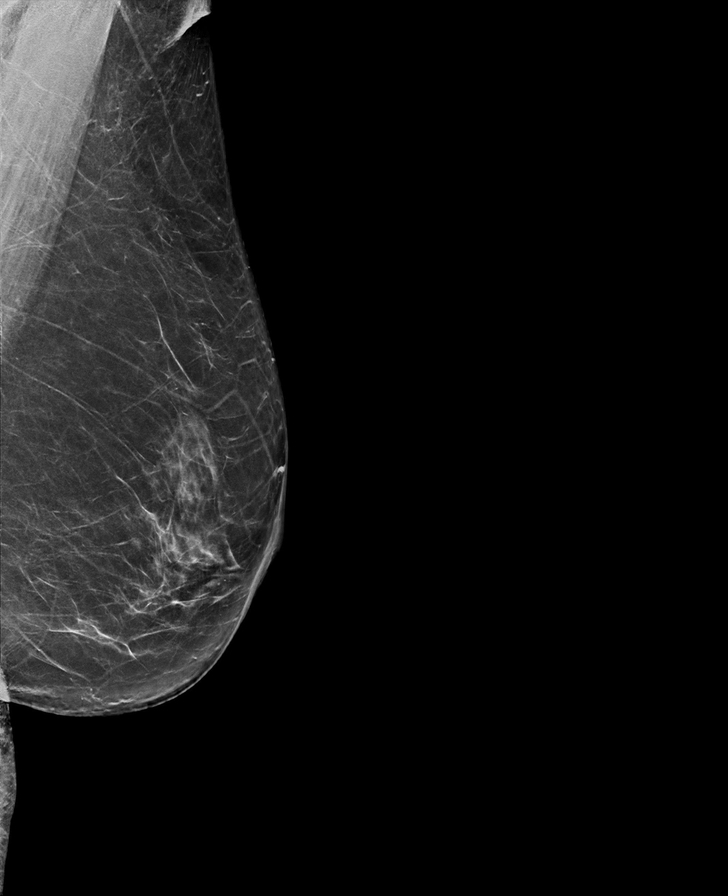

[R MLO synth-2D]
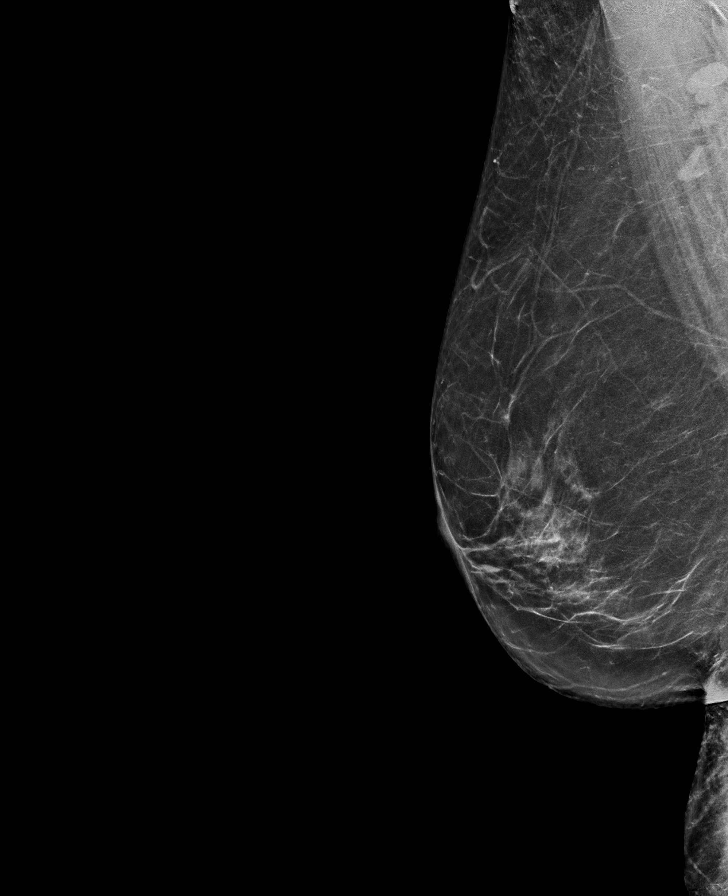

[R CC synth-2D]
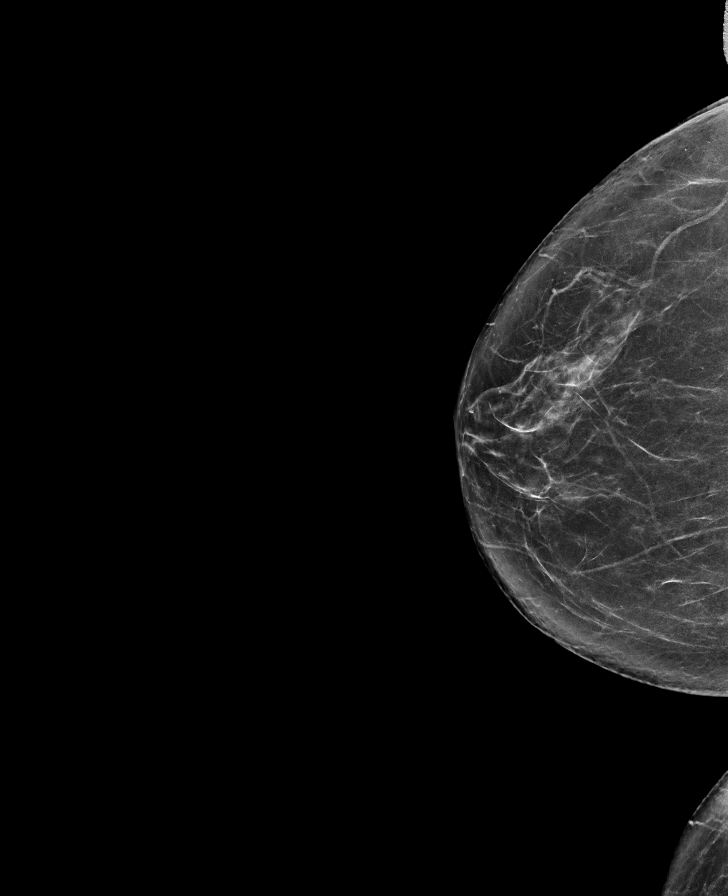

[R MLO tomo · tomo slice 37/72.0]
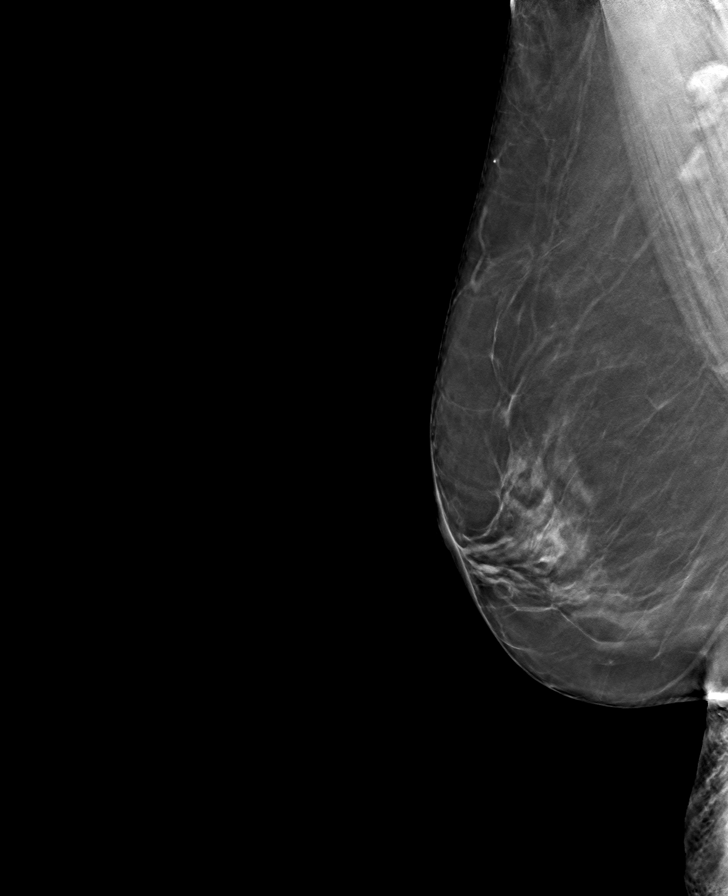

[L MLO tomo · tomo slice 39/78.0]
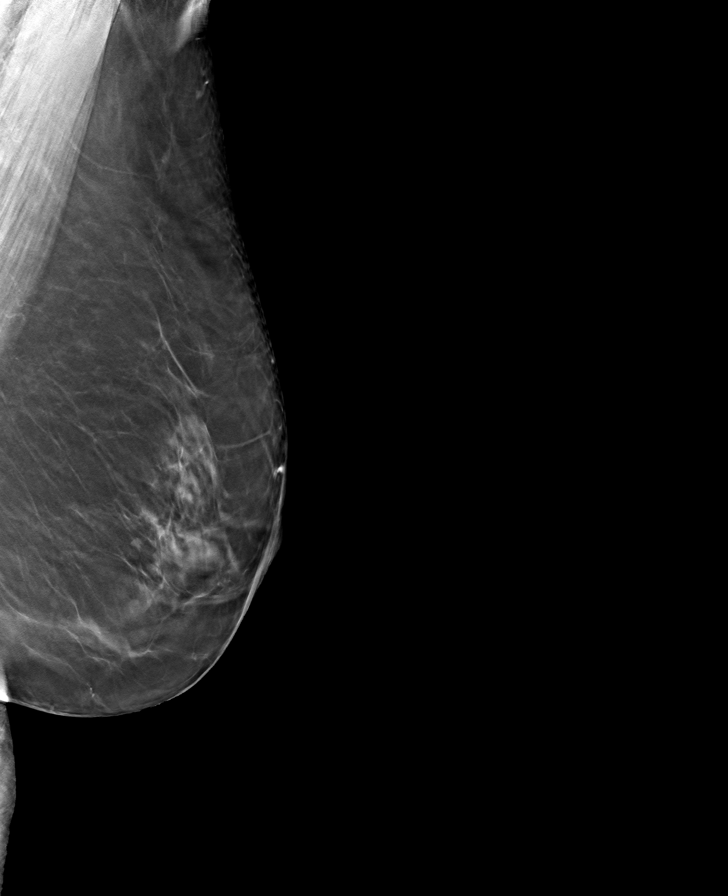

[R CC tomo · tomo slice 37/72.0]
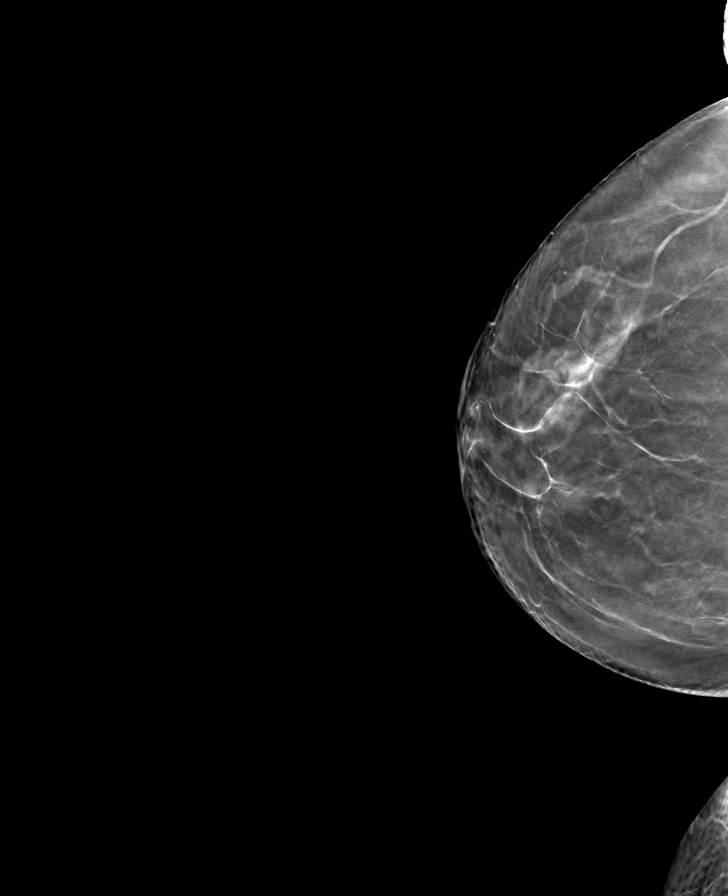

[L CC tomo · tomo slice 33/66.0]
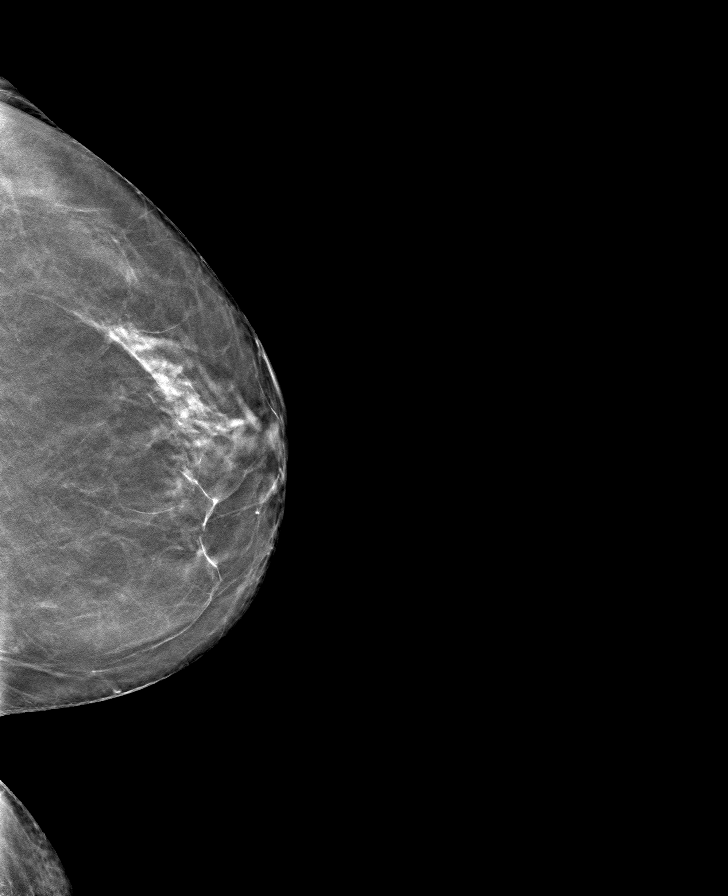

[8 of 24 positions shown; findings below may reference images not displayed]

ACR Breast Density Category b: There are scattered areas of
fibroglandular density.
FINDINGS: In the left breast, a possible mass warrants further evaluation. In
the right breast, no findings suspicious for malignancy.

Images were processed with CAD.
IMPRESSION: Further evaluation is suggested for possible mass in the left
breast.

RECOMMENDATION:
Diagnostic mammogram and possibly ultrasound of the left breast.
(Code:JC-[id number])

The patient will be contacted regarding the findings, and additional
imaging will be scheduled.

BI-RADS CATEGORY  0: Incomplete. Need additional imaging evaluation
and/or prior mammograms for comparison.

## 2021-10-24 ENCOUNTER — Telehealth: Payer: Self-pay

## 2021-10-24 NOTE — Telephone Encounter (Signed)
History and specimen tracking updated per MOHs progress note. 

## 2021-11-16 ENCOUNTER — Ambulatory Visit (INDEPENDENT_AMBULATORY_CARE_PROVIDER_SITE_OTHER): Payer: Medicare Other | Admitting: Dermatology

## 2021-11-16 DIAGNOSIS — L821 Other seborrheic keratosis: Secondary | ICD-10-CM

## 2021-11-16 DIAGNOSIS — L820 Inflamed seborrheic keratosis: Secondary | ICD-10-CM

## 2021-11-16 DIAGNOSIS — Z85828 Personal history of other malignant neoplasm of skin: Secondary | ICD-10-CM

## 2021-11-16 DIAGNOSIS — D229 Melanocytic nevi, unspecified: Secondary | ICD-10-CM

## 2021-11-16 DIAGNOSIS — Z1283 Encounter for screening for malignant neoplasm of skin: Secondary | ICD-10-CM | POA: Diagnosis not present

## 2021-11-16 DIAGNOSIS — L578 Other skin changes due to chronic exposure to nonionizing radiation: Secondary | ICD-10-CM | POA: Diagnosis not present

## 2021-11-16 DIAGNOSIS — D692 Other nonthrombocytopenic purpura: Secondary | ICD-10-CM | POA: Diagnosis not present

## 2021-11-16 DIAGNOSIS — L814 Other melanin hyperpigmentation: Secondary | ICD-10-CM

## 2021-11-16 DIAGNOSIS — Z8589 Personal history of malignant neoplasm of other organs and systems: Secondary | ICD-10-CM

## 2021-11-16 NOTE — Patient Instructions (Addendum)
Seborrheic Keratosis  What causes seborrheic keratoses? Seborrheic keratoses are harmless, common skin growths that first appear during adult life.  As time goes by, more growths appear.  Some people may develop a large number of them.  Seborrheic keratoses appear on both covered and uncovered body parts.  They are not caused by sunlight.  The tendency to develop seborrheic keratoses can be inherited.  They vary in color from skin-colored to gray, brown, or even black.  They can be either smooth or have a rough, warty surface.   Seborrheic keratoses are superficial and look as if they were stuck on the skin.  Under the microscope this type of keratosis looks like layers upon layers of skin.  That is why at times the top layer may seem to fall off, but the rest of the growth remains and re-grows.    Treatment Seborrheic keratoses do not need to be treated, but can easily be removed in the office.  Seborrheic keratoses often cause symptoms when they rub on clothing or jewelry.  Lesions can be in the way of shaving.  If they become inflamed, they can cause itching, soreness, or burning.  Removal of a seborrheic keratosis can be accomplished by freezing, burning, or surgery. If any spot bleeds, scabs, or grows rapidly, please return to have it checked, as these can be an indication of a skin cancer.  Cryotherapy Aftercare  Wash gently with soap and water everyday.   Apply Vaseline and Band-Aid daily until healed.    Due to recent changes in healthcare laws, you may see results of your pathology and/or laboratory studies on MyChart before the doctors have had a chance to review them. We understand that in some cases there may be results that are confusing or concerning to you. Please understand that not all results are received at the same time and often the doctors may need to interpret multiple results in order to provide you with the best plan of care or course of treatment. Therefore, we ask that you  please give us 2 business days to thoroughly review all your results before contacting the office for clarification. Should we see a critical lab result, you will be contacted sooner.   If You Need Anything After Your Visit  If you have any questions or concerns for your doctor, please call our main line at [phone] and press option 4 to reach your doctor's medical assistant. If no one answers, please leave a voicemail as directed and we will return your call as soon as possible. Messages left after 4 pm will be answered the following business day.   You may also send us a message via MyChart. We typically respond to MyChart messages within 1-2 business days.  For prescription refills, please ask your pharmacy to contact our office. Our fax number is [phone].  If you have an urgent issue when the clinic is closed that cannot wait until the next business day, you can page your doctor at the number below.    Please note that while we do our best to be available for urgent issues outside of office hours, we are not available 24/7.   If you have an urgent issue and are unable to reach us, you may choose to seek medical care at your doctor's office, retail clinic, urgent care center, or emergency room.  If you have a medical emergency, please immediately call 911 or go to the emergency department.  Pager Numbers  - Dr. Kowalski: [phone]  -   Dr. Moye: [phone]  - Dr. Stewart: [phone]  In the event of inclement weather, please call our main line at [phone] for an update on the status of any delays or closures.  Dermatology Medication Tips: Please keep the boxes that topical medications come in in order to help keep track of the instructions about where and how to use these. Pharmacies typically print the medication instructions only on the boxes and not directly on the medication tubes.   If your medication is too expensive, please contact our office at  [phone] option 4 or send us a message through MyChart.   We are unable to tell what your co-pay for medications will be in advance as this is different depending on your insurance coverage. However, we may be able to find a substitute medication at lower cost or fill out paperwork to get insurance to cover a needed medication.   If a prior authorization is required to get your medication covered by your insurance company, please allow us 1-2 business days to complete this process.  Drug prices often vary depending on where the prescription is filled and some pharmacies may offer cheaper prices.  The website www.goodrx.com contains coupons for medications through different pharmacies. The prices here do not account for what the cost may be with help from insurance (it may be cheaper with your insurance), but the website can give you the price if you did not use any insurance.  - You can print the associated coupon and take it with your prescription to the pharmacy.  - You may also stop by our office during regular business hours and pick up a GoodRx coupon card.  - If you need your prescription sent electronically to a different pharmacy, notify our office through [facility] MyChart or by phone at [phone] option 4.     Si Usted Necesita Algo Despus de Su Visita  Tambin puede enviarnos un mensaje a travs de MyChart. Por lo general respondemos a los mensajes de MyChart en el transcurso de 1 a 2 das hbiles.  Para renovar recetas, por favor pida a su farmacia que se ponga en contacto con nuestra oficina. Nuestro nmero de fax es el [phone].  Si tiene un asunto urgente cuando la clnica est cerrada y que no puede esperar hasta el siguiente da hbil, puede llamar/localizar a su doctor(a) al nmero que aparece a continuacin.   Por favor, tenga en cuenta que aunque hacemos todo lo posible para estar disponibles para asuntos urgentes fuera del horario de oficina, no estamos  disponibles las 24 horas del da, los 7 das de la semana.   Si tiene un problema urgente y no puede comunicarse con nosotros, puede optar por buscar atencin mdica  en el consultorio de su doctor(a), en una clnica privada, en un centro de atencin urgente o en una sala de emergencias.  Si tiene una emergencia mdica, por favor llame inmediatamente al 911 o vaya a la sala de emergencias.  Nmeros de bper  - Dr. Kowalski: [phone]  - Dra. Moye: [phone]  - Dra. Stewart: [phone]  En caso de inclemencias del tiempo, por favor llame a nuestra lnea principal al [phone] para una actualizacin sobre el estado de cualquier retraso o cierre.  Consejos para la medicacin en dermatologa: Por favor, guarde las cajas en las que vienen los medicamentos de uso tpico para ayudarle a seguir las instrucciones sobre dnde y cmo usarlos. Las farmacias generalmente imprimen las instrucciones del medicamento slo en las cajas y   no directamente en los tubos del medicamento.   Si su medicamento es muy caro, por favor, pngase en contacto con nuestra oficina llamando al [phone] y presione la opcin 4 o envenos un mensaje a travs de MyChart.   No podemos decirle cul ser su copago por los medicamentos por adelantado ya que esto es diferente dependiendo de la cobertura de su seguro. Sin embargo, es posible que podamos encontrar un medicamento sustituto a menor costo o llenar un formulario para que el seguro cubra el medicamento que se considera necesario.   Si se requiere una autorizacin previa para que su compaa de seguros cubra su medicamento, por favor permtanos de 1 a 2 das hbiles para completar este proceso.  Los precios de los medicamentos varan con frecuencia dependiendo del lugar de dnde se surte la receta y alguna farmacias pueden ofrecer precios ms baratos.  El sitio web www.goodrx.com tiene cupones para medicamentos de diferentes farmacias. Los precios aqu no  tienen en cuenta lo que podra costar con la ayuda del seguro (puede ser ms barato con su seguro), pero el sitio web puede darle el precio si no utiliz ningn seguro.  - Puede imprimir el cupn correspondiente y llevarlo con su receta a la farmacia.  - Tambin puede pasar por nuestra oficina durante el horario de atencin regular y recoger una tarjeta de cupones de GoodRx.  - Si necesita que su receta se enve electrnicamente a una farmacia diferente, informe a nuestra oficina a travs de MyChart de [facility] o por telfono llamando al [phone] y presione la opcin 4.  

## 2021-11-16 NOTE — Progress Notes (Signed)
Follow-Up Visit   Subjective  Kaitlyn Bridges is a 87 y.o. female who presents for the following: Annual Exam. The patient presents for Total-Body Skin Exam (TBSE) for skin cancer screening and mole check.  The patient has spots, moles and lesions to be evaluated, some may be new or changing and the patient has concerns that these could be cancer.   The following portions of the chart were reviewed this encounter and updated as appropriate:   Tobacco  Allergies  Meds  Problems  Med Hx  Surg Hx  Fam Hx     Review of Systems:  No other skin or systemic complaints except as noted in HPI or Assessment and Plan.  Objective  Well appearing patient in no apparent distress; mood and affect are within normal limits.  A full examination was performed including scalp, head, eyes, ears, nose, lips, neck, chest, axillae, abdomen, back, buttocks, bilateral upper extremities, bilateral lower extremities, hands, feet, fingers, toes, fingernails, and toenails. All findings within normal limits unless otherwise noted below.  right proximal mandible x 2, left deltoid x 2, left top of shoulder x 1, right thigh x 3  (8) (8) Stuck-on, waxy, tan-brown papules --Discussed benign etiology and prognosis.    Assessment & Plan  Inflamed seborrheic keratosis (8) right proximal mandible x 2, left deltoid x 2, left top of shoulder x 1, right thigh x 3  (8)  Symptomatic, irritating, patient would like treated.   Destruction of lesion - right proximal mandible x 2, left deltoid x 2, left top of shoulder x 1, right thigh x 3  (8) Complexity: simple   Destruction method: cryotherapy   Informed consent: discussed and consent obtained   Timeout:  patient name, date of birth, surgical site, and procedure verified Lesion destroyed using liquid nitrogen: Yes   Region frozen until ice ball extended beyond lesion: Yes   Outcome: patient tolerated procedure well with no complications   Post-procedure details: wound  care instructions given    Purpura - Chronic; persistent and recurrent.  Treatable, but not curable. - Violaceous macules and patches - Benign - Related to trauma, age, sun damage and/or use of blood thinners, chronic use of topical and/or oral steroids - Observe - Can use OTC arnica containing moisturizer such as Dermend Bruise Formula if desired - Call for worsening or other concerns   Lentigines - Scattered tan macules - Due to sun exposure - Benign-appearing, observe - Recommend daily broad spectrum sunscreen SPF 30+ to sun-exposed areas, reapply every 2 hours as needed. - Call for any changes  Seborrheic Keratoses - Stuck-on, waxy, tan-brown papules and/or plaques  - Benign-appearing - Discussed benign etiology and prognosis. - Observe - Call for any changes  Melanocytic Nevi - Tan-brown and/or pink-flesh-colored symmetric macules and papules - Benign appearing on exam today - Observation - Call clinic for new or changing moles - Recommend daily use of broad spectrum spf 30+ sunscreen to sun-exposed areas.   Hemangiomas - Red papules - Discussed benign nature - Observe - Call for any changes  Actinic Damage - Chronic condition, secondary to cumulative UV/sun exposure - diffuse scaly erythematous macules with underlying dyspigmentation - Recommend daily broad spectrum sunscreen SPF 30+ to sun-exposed areas, reapply every 2 hours as needed.  - Staying in the shade or wearing long sleeves, sun glasses (UVA+UVB protection) and wide brim hats (4-inch brim around the entire circumference of the hat) are also recommended for sun protection.  - Call for new or  changing lesions.  History of Squamous Cell Carcinoma of the Skin treated with Mohs surgery 10/11/2021 Left sup paranasal area  - No evidence of recurrence today - No lymphadenopathy - Recommend regular full body skin exams - Recommend daily broad spectrum sunscreen SPF 30+ to sun-exposed areas, reapply every 2  hours as needed.  - Call if any new or changing lesions are noted between office visits   Skin cancer screening performed today.   Return in about 1 year (around 11/17/2022) for TBSE, hx of SCC .  IMarye Round, CMA, am acting as scribe for Sarina Ser, MD .  Documentation: I have reviewed the above documentation for accuracy and completeness, and I agree with the above.  Sarina Ser, MD

## 2021-12-05 ENCOUNTER — Encounter: Payer: Self-pay | Admitting: Dermatology

## 2022-11-22 ENCOUNTER — Ambulatory Visit: Payer: Medicare Other | Admitting: Dermatology
